# Patient Record
Sex: Male | Born: 1935 | Race: White | Hispanic: No | Marital: Married | Smoking: Never smoker
Health system: Southern US, Community
[De-identification: ages and names within clinical notes are randomized; demographics above are authoritative.]

## PROBLEM LIST (undated history)

## (undated) DIAGNOSIS — E7800 Pure hypercholesterolemia, unspecified: Secondary | ICD-10-CM

## (undated) DIAGNOSIS — C801 Malignant (primary) neoplasm, unspecified: Secondary | ICD-10-CM

## (undated) DIAGNOSIS — J45909 Unspecified asthma, uncomplicated: Secondary | ICD-10-CM

## (undated) DIAGNOSIS — I2510 Atherosclerotic heart disease of native coronary artery without angina pectoris: Secondary | ICD-10-CM

## (undated) HISTORY — DX: Pure hypercholesterolemia, unspecified: E78.00

## (undated) HISTORY — PX: LEG SURGERY: SHX1003

## (undated) HISTORY — DX: Unspecified asthma, uncomplicated: J45.909

## (undated) HISTORY — PX: HERNIA REPAIR: SHX51

## (undated) HISTORY — PX: INSERTION PROSTATE RADIATION SEED: SUR718

## (undated) HISTORY — PX: CATARACT EXTRACTION, BILATERAL: SHX1313

## (undated) HISTORY — PX: CORONARY ARTERY BYPASS GRAFT: SHX141

---

## 1996-02-16 HISTORY — PX: CATARACT EXTRACTION W/PHACO: SHX586

## 1997-07-23 ENCOUNTER — Other Ambulatory Visit: Admission: RE | Admit: 1997-07-23 | Discharge: 1997-07-23 | Payer: Self-pay | Admitting: Urology

## 1998-08-28 ENCOUNTER — Other Ambulatory Visit: Admission: RE | Admit: 1998-08-28 | Discharge: 1998-08-28 | Payer: Self-pay | Admitting: Internal Medicine

## 1998-12-24 ENCOUNTER — Ambulatory Visit (HOSPITAL_COMMUNITY): Admission: RE | Admit: 1998-12-24 | Discharge: 1998-12-24 | Payer: Self-pay | Admitting: Urology

## 1998-12-25 ENCOUNTER — Ambulatory Visit (HOSPITAL_COMMUNITY): Admission: RE | Admit: 1998-12-25 | Discharge: 1998-12-25 | Payer: Self-pay | Admitting: Gastroenterology

## 1998-12-25 ENCOUNTER — Encounter (INDEPENDENT_AMBULATORY_CARE_PROVIDER_SITE_OTHER): Payer: Self-pay | Admitting: Specialist

## 1999-08-06 ENCOUNTER — Ambulatory Visit (HOSPITAL_COMMUNITY): Admission: RE | Admit: 1999-08-06 | Discharge: 1999-08-06 | Payer: Self-pay | Admitting: Gastroenterology

## 1999-08-06 ENCOUNTER — Encounter (INDEPENDENT_AMBULATORY_CARE_PROVIDER_SITE_OTHER): Payer: Self-pay | Admitting: Specialist

## 2001-02-15 HISTORY — PX: CATARACT EXTRACTION W/PHACO: SHX586

## 2001-06-16 ENCOUNTER — Ambulatory Visit (HOSPITAL_COMMUNITY): Admission: RE | Admit: 2001-06-16 | Discharge: 2001-06-16 | Payer: Self-pay | Admitting: Urology

## 2001-06-16 ENCOUNTER — Encounter: Payer: Self-pay | Admitting: Urology

## 2002-07-27 ENCOUNTER — Encounter (INDEPENDENT_AMBULATORY_CARE_PROVIDER_SITE_OTHER): Payer: Self-pay | Admitting: Specialist

## 2002-07-27 ENCOUNTER — Ambulatory Visit (HOSPITAL_COMMUNITY): Admission: RE | Admit: 2002-07-27 | Discharge: 2002-07-27 | Payer: Self-pay | Admitting: *Deleted

## 2003-12-17 ENCOUNTER — Encounter: Admission: RE | Admit: 2003-12-17 | Discharge: 2003-12-17 | Payer: Self-pay | Admitting: Urology

## 2006-10-24 ENCOUNTER — Inpatient Hospital Stay (HOSPITAL_COMMUNITY): Admission: AD | Admit: 2006-10-24 | Discharge: 2006-10-26 | Payer: Self-pay | Admitting: Internal Medicine

## 2006-11-10 ENCOUNTER — Encounter (HOSPITAL_COMMUNITY): Admission: RE | Admit: 2006-11-10 | Discharge: 2007-02-08 | Payer: Self-pay | Admitting: Interventional Cardiology

## 2008-01-29 ENCOUNTER — Ambulatory Visit: Payer: Self-pay | Admitting: Vascular Surgery

## 2010-03-18 ENCOUNTER — Other Ambulatory Visit: Payer: Self-pay | Admitting: Dermatology

## 2010-06-30 NOTE — Cardiovascular Report (Signed)
[name] NO.:  [id number]   MEDICAL RECORD NO.:  [id number]          PATIENT TYPE:  INP   LOCATION:  2807                         FACILITY:  [facility]   PHYSICIAN:  [name], M.D.   DATE OF BIRTH:  11-03-1932   DATE OF PROCEDURE:  DATE OF DISCHARGE:                            CARDIAC CATHETERIZATION   INDICATIONS:  Acute coronary syndrome.   PROCEDURE PERFORMED:  1. Left heart cath  2. Selective coronary angiography  3. Left ventriculography.  4. Catheter thrombectomy.  5. Bare metal stent implantation.   DESCRIPTION:  After informed consent under urgent circumstances, the  patient was brought to the laboratory where a 6-French sheath was placed  using modified Seldinger technique.  A 6-French A2 multipurpose catheter  was then used for hemodynamic recordings, left ventriculography by hand  injection, and selective right coronary angiography.  A 6-French left  Judkins catheter was used for left coronary angiography.  We identified  high-grade obstruction in the mid circumflex with a large distal  territory.  After some discussion, we proceeded with PCI.   The patient had been started on upstream Bivalirudin therapy before he  left the floor headed to the cath lab.  He was loaded with 600 mg of  Plavix.  We used an additional bolus of Angiomax  to get ACT greater  than 300 seconds.  We then used ACLS 6-French left coronary system guide  catheter to perform PCI with a [employer] guidewire.  We used a  Fetch thrombectomy catheter and two passes were made.  No obvious  thrombus was recovered.   We then performed PCI using a 3.0 x 15 Maverick balloon.  After  reestablishing brisk antegrade flow, we placed a stent, bare metal  Vision 3.5 x 18.  After stent deployment there was slow flow noted in  the TIMI grade I-II category.  We immediately performed pharmacologic  therapy by giving intracoronary nitroglycerin initially 400 micrograms  and  also 52 micrograms of adenosine intracoronary.  Both of these  medications were given in aliquots of 50% of the total dose mentioned.  We then postdilated with a 3.75 x 15 Quantum balloon to 12 atmospheres  each for approximately 30 seconds.  No waist was noted on the balloon.  Additional pharmacologic therapy with 2 or 3 additional 200 microgram  aliquots of nitroglycerin and another 2-3 aliquots of intracoronary  adenosine each of 26 mcg.  There was gradual resolution and slow flow ST-  segment resolution.  At the completion of the case Angio-Seal was  performed with good hemostasis.  The patient left the room.  Chest  discomfort was present but less than grade 2.  During the procedure it  had been as high as grade 10/10.  Total duration of chest discomfort in  the laboratory was approximately 60 minutes.   RESULTS:   HEMODYNAMIC DATA:  1. Aortic pressure 124/72.  2. Left ventricular pressure 125/12.   LEFT VENTRICULOGRAPHY:  Distal anterolateral hypokinesis is noted.  EF  50%.   CORONARY ANGIOGRAPHY:  1. Left main coronary:  Widely patent.  2.  Left anterior descending coronary:  Less than 50% proximal with a      large proximal arising first diagonal that contains ostial 40%      stenosis.  3. Circumflex artery:  The midvessel contains a thrombotic bulky 99%      stenosis with a large distal distribution giving origin to obtuse      marginals.  4. RCA widely patent.   CIRCUMFLEX PCI:  Circumflex 99% with decreased flow pre PCI and  following Fetch, balloon angioplasty, stent deployment, and post  dilatation 0% stenosis was noted with eventual attainment of TIMI grade  3 flow with pharmacologic therapy and resolution of ST elevation.   ANGIOSEAL ARTERIOTOMY CLOSURE:  With good hemostasis.   CONCLUSION:  1. Acute coronary syndrome secondary to high-grade thrombotic      circumflex lesion treated successfully with bare metal stent from      99% to 0% initially complicated by  low flow but resolved with      pharmacologic therapy.  2. Less than 50% proximal LAD normal right coronary.  3. Left ventricular wall motion abnormality involving the      anterolateral wall and circumflex distribution.   PLAN:  1. Integrilin x18 hours.  2. Angiomax discontinued.  3. Aspirin and Plavix for one year.  4. Serial markers.  5. Home Wednesday or Thursday depending upon course and degree of      enzyme elevation.      [name], M.D.  Electronically Signed     HWS/MEDQ  D:  10/24/2006  T:  10/25/2006  Job:  [id number]   cc:   Candyce Churn, M.D.

## 2010-06-30 NOTE — Discharge Summary (Signed)
NAMECHRISTIAN, [name]                ACCOUNT NO.:  [id number]   MEDICAL RECORD NO.:  [id number]          PATIENT TYPE:  INP   LOCATION:  2922                         FACILITY:  [facility]   PHYSICIAN:  Candyce Churn, M.D.DATE OF BIRTH:  20-Jan-1933   DATE OF ADMISSION:  10/24/2006  DATE OF DISCHARGE:  10/26/2006                               DISCHARGE SUMMARY   DISCHARGE DIAGNOSES:  1. Acute coronary syndrome secondary to 99% circumflex lesion.  2. Coronary stenting to the circumflex with grade 3 TIMI flow      postprocedure and 0% stenosis.  3. Mild elevation in cardiac enzymes following the stenting procedure      secondary to washout phenomenon with mild chest pain - resolved.  4. History of hypercholesterolemia.  5. Mild type 2 diabetes mellitus.  6. Prostate cancer - October 1997.  7. Erectile dysfunction.  8. Gastroesophageal reflux disease.  9. Exercise-induced asthma.  10.History of tubulovillous adenoma - followed by Dr. Danise Edge.  11.Question of a transient ischemic attack in May 2001 - had some      speech difficulty at the time and carotid Dopplers revealed      calcific plaque but no significant stenosis.   DISCHARGE MEDICATIONS:  1. Plavix 75 mg daily.  2. Ecotrin 325 mg daily.  3. Protonix 40 mg daily.  4. Azmacort to puffs inhaled b.i.d.  5. Intal inhaler 2 puffs b.i.d.  6. Allegra 60 mg p.o. daily p.r.n.  7. Vytorin 10/40 mg daily.  8. Glucophage 500 mg b.i.d. - resume October 31, 2006.  9. Sublingual nitroglycerin 0.4 mg p.r.n. chest pain every five      minutes p.r.n. chest pain.   CONSULTATION:  Cardiology, Dr. Verdis Prime.   PROCEDURES:  Cardiac catheterization October 24, 2006, per Dr. Verdis Prime revealed an ejection fraction of 50% with anterolateral  hypokinesis.  Left main was normal.  LAD had less than 50% proximal  lesion.  Circumflex had 99% mid thrombotic lesion with a large distal  distribution Right coronary was normal.   The  patient had a bare metal stent placed to the circumflex with initial  99% occlusion being stented to 0% occlusion with complication of slow  flow with pain for 1-2 hours at procedure.  He did have a left  ventricular wall motion abnormality at the time and EF was 60%.  Treated  with Integrilin, with Angiomax and aspirin and Plavix.   HOSPITAL COURSE:  The patient is a very pleasant 77 year old male who  had approximately one week of intermittent substernal chest pain,  characterized as burning with some radiation to his left shoulder and  arm.  It was initially described as nonexertional and then he recalled  that he did have some exertional chest pain 7-10 days ago with walking.  Denied any throat or neck pain.  His pain did respond to sublingual  nitroglycerin on the day prior to admission.  Antacids were not helpful.  He denied any fevers or chills or shortness of breath, diaphoresis or  PND.  He presented to [facility] with an EKG that  showed a very minimal elevation in ST-segment V1 to V3 - 0.5 to 1 mm and  otherwise normal EKG.   He was admitted to [facility] and seen in consultation by Dr.  Verdis Prime and the above procedures were performed with the diagnosis  of coronary artery disease, with critical stenosis in his left  circumflex.   The patient has done well since his procedure with some chest pain for  approximately 12 hours after the procedure.  He is now approximately 24  hours without any chest pain and feeling well and plans are to discharge  him home to start cardiac rehab as per cardiology and to follow-up with  cardiology at their discretion.   We will withhold ACE receptor blockade and beta blockers because of his  relative bradycardia and his borderline low blood pressure at 100  systolic.   He will be discharged on the above medications with further changes per  Dr. Katrinka Blazing and he can resume his Glucophage next week and he will  follow  his blood sugars b.i.d.   DISCHARGE LABORATORY DATA:  Sed rate 13 mm an hour.  Sodium 139,  potassium 4.5, chloride 103, bicarb 28, glucose 109, BUN 11, creatinine  1.25, calcium 9.2.  Troponin at discharge is 2.88.  CK-MB was 7.7 down  from 19.6 from October 25, 2006.  CK was within normal limits at 160  having peaked at 330 on October 25, 2006.  CK-MB on discharge 4.8,  trending down from 7.5 the day before.  Hemoglobin was 13.2, white cell  count 7200, platelet count 293,000.   Again, CK max was 330 with CK-MB max was 27.2 and a troponin-I of 7.08  max on October 25, 2006.   Postprocedure EKG on October 25, 2006, revealed sinus bradycardia at  52,  left anterior fascicular block and nonspecific ST-T wave  abnormalities noted.  No significant change from EKG of October 24, 2006.      Candyce Churn, M.D.  Electronically Signed     RNG/MEDQ  D:  10/26/2006  T:  10/26/2006  Job:  [id number]   cc:   [name], M.D.

## 2010-06-30 NOTE — Consult Note (Signed)
[name], [name]                ACCOUNT NO.:  [id number]   MEDICAL RECORD NO.:  [id number]          PATIENT TYPE:  INP   LOCATION:  2922                         FACILITY:  [facility]   PHYSICIAN:  [name], M.D.   DATE OF BIRTH:  1932-06-29   DATE OF CONSULTATION:  10/24/2006  DATE OF DISCHARGE:                                 CONSULTATION   CARDIOLOGY CONSULTATION:   REASON FOR CONSULTATION:  Chest discomfort.   CONCLUSIONS:  1. Acute coronary syndrome with crescendo pattern, of concern for pre-      infarction syndrome.  2. History of hyperlipidemia, treated.  3. Diabetes.   RECOMMENDATIONS:  1. Start antithrombin therapy in the form of bivalirudin bolus and      infusion.  2. Aspirin.  3. We will withhold Plavix until anatomy is defined.  4. Urgent cardiac catheterization.  5. IV nitroglycerin.  6. Heart catheterization possible PCI, possible surgery and attendant      risks involved in emergency instrumentation during acute coronary      syndrome is discussed with the patient and wife.  Quoted 2-3% risk      of myocardial infarction, 1% risk of need for emergency surgery,      bleeding, contrast allergy, death, among others.   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  For slightly under a  week he has been experiencing recurring midsternal chest discomfort with  radiation to the back.  The initial episode occurred awakening him from  sleep one evening approximately 5 days prior to admission.  Through the  weekend he had progressively more frequent episodes of recurrent  discomfort, each lasting anywhere from 20 to 45 minutes before  resolving.  He spoke with Dr. Kevan Ny by phone this weekend and  nitroglycerin was given by phone-in.  He has been using nitroglycerin  over the weekend with relief of the discomfort.  He came to see Dr.  Kevan Ny this morning and has been admitted to the hospital  with crescendo  recurrent episodes of angina.  Most recent episode approximately 45  minutes before this consultation was performed, which had lasted  anywhere from 30 to 60 minutes.  EKG during pain demonstrated hyperacute  precordial T-wave abnormality but no ST elevation and no frank Q waves,  although there is incomplete right bundle branch block versus true  posterior MI and lateral T-wave inversions in I and aVL.   MEDICATIONS:  1. Vytorin 10/40 mg.  2. Albuterol and Azmacort daily.  3. Protonix daily.  4. Aspirin 81 mg two daily.  5. Glucophage 500 mg a.m., 1000 mg p.m.   HABITS:  Does not smoke.  He has a glass of wine usually daily.   SIGNIFICANT PAST MEDICAL HISTORY:  1. Hyperlipidemia.  2. Prostate cancer treated with seeds.  3. Erectile dysfunction.  4. GERD.  5. Asthma.  6. Colonic tubulovillous adenoma.  7. Transient ischemic attack 2001.   EXAM:  The patient is in no acute distress.  He is not having chest  discomfort currently.  Blood pressure 145/94, heart rate 60, weight is not recorded,  respirations 20.  HEENT:  Unremarkable.  CHEST:  Clear.  CARDIAC:  No murmur, no click and no rub.  ABDOMEN:  Soft.  EXTREMITIES:  No edema.  NEUROLOGIC:  Unremarkable.   ECG reveals sinus bradycardia, tall RV-1, most compatible with  incomplete right bundle, cannot rule out a posterior Q-wave infarction.  There is prominent peaked T-wave abnormality noted in the early  precordial leads, T-wave inversion noted in I and aVL.   LABORATORY DATA:  All pending.   Chest x-ray is pending.   DISCUSSION:  This patient is having crescendo/pre-infarction angina.  I  suspect he will have positive markers.  With recurrent episodes of  discomfort, we will go ahead and emergently take the patient to the cath  lab to define anatomy and, hopefully, provide some mechanical relief of  the patient's symptoms.  Angiomax is started.  Aspirin will be given.  Plavix will be  withheld.      [name], M.D.  Electronically Signed     HWS/MEDQ  D:  10/25/2006  T:  10/25/2006  Job:  [id number]   cc:   Candyce Churn, M.D.

## 2010-06-30 NOTE — Procedures (Signed)
CAROTID DUPLEX EXAM   INDICATION:  Carotid artery disease.   HISTORY:  Diabetes:  Yes.  Cardiac:  Yes.  Hypertension:  No.  Smoking:  No.  Previous Surgery:  No.  CV History:  TIA.  Amaurosis Fugax No, Paresthesias No, Hemiparesis No.                                       RIGHT             LEFT  Brachial systolic pressure:  Brachial Doppler waveforms:  Vertebral direction of flow:        Antegrade         Antegrade  DUPLEX VELOCITIES (cm/sec)  CCA peak systolic                   103               81  ECA peak systolic                   131               93  ICA peak systolic                   76                88  ICA end diastolic                   31                35  PLAQUE MORPHOLOGY:                                    Calcified  PLAQUE AMOUNT:                                        Moderate  PLAQUE LOCATION:                                      Bifurcation, ICA   IMPRESSION:  1. Normal right internal carotid artery.  2. 20-39% left internal carotid artery stenosis.       ___________________________________________  Quita Skye Hart [place], M.D.   AC/MEDQ  D:  01/31/2008  T:  01/31/2008  Job:  [id number]

## 2010-06-30 NOTE — H&P (Signed)
[name], [name]                ACCOUNT NO.:  [id number]   MEDICAL RECORD NO.:  [id number]          PATIENT TYPE:  INP   LOCATION:  2807                         FACILITY:  [facility]   PHYSICIAN:  Candyce Churn, M.D.DATE OF BIRTH:  07-22-1932   DATE OF ADMISSION:  10/24/2006  DATE OF DISCHARGE:                              HISTORY & PHYSICAL   CHIEF COMPLAINT:  Chest pain.   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  He also has a  history of mild aortic sclerosis.   Over the past week, the patient has noticed substernal chest burning  which has been resistant to increasing doses of PPI therapy and added H2  blockers.  Oral antacids have not been helpful.  He has not had  diaphoresis or shortness of breath and does not seem to have effected  his exertional tolerance.  It is substernal.  It does burn, but also is  referred to his left shoulder and down his left arm.  No throat or neck  pain history.  Otherwise feels well.  Denies any fevers or chills.  It  has responded to some nitroglycerin over the past 24 hours, usually in  about 5 or 6 minutes after taking nitroglycerin it will go away.  There  is a burning sensation initially, substernally and then a squeezing  sensation that radiates to the left back and shoulder.   His EKG today reveals an ST-segment elevation in V1 through V3 which is  0.5 to 1 mm but does seem to be present in comparison to old EKGs.   MEDICATIONS:  1. Glucophage 500 mg b.i.d.  2. Vytorin 10/40 daily.  3. Aspirin 81 mg a day.  4. Azmacort inhaler two puffs b.i.d.  5. Intal inhaler 2 puffs b.i.d.  6. Albuterol inhaler 1-2 puffs p.r.n.  7. Allegra 60 mg p.r.n.  8. Selenium sulfides shampoo used for scalp seborrhea.  9. Prilosec 20 twice daily and then changed to Protonix 40 mg twice      daily  yesterday.  10.The patient has also been using some Zantac.   OTHER PAST MEDICAL HISTORY:  Treatment for prostate cancer in 1997 with  seed implantation and then Lupron therapy.   PAST SURGICAL HISTORY:  1. Removal of a tubulovillous adenoma colonoscopically in June 2004.  2. Hemorrhoidectomy 1965.  3. Prostate biopsy in 1998.  4. Radium seed implants for prostate 1998.  5. Right inguinal hernia (1990).  6. Distant history of tonsillectomy and adenoidectomy.   HEALTH MAINTENANCE:  Last tetanus was in February 1998.  Flu vaccine  November 2007.  Pneumovax October 2002.   FAMILY HISTORY:  Father died of metastatic prostate cancer.  Mother had  rheumatic heart disease.   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  Lives in [place].  No tobacco use.  Has a glass of  wine on a fairly regular basis.   REVIEW OF SYSTEMS:  As per HPI.  No recent change in bowel habits or  urinary habits.  No fever or chills as mentioned.   PHYSICAL EXAMINATION:  GENERAL:  Alert, oriented male in no distress,  complaining of pain that is intermittent.  No response to nitroglycerin  substernally.  VITAL SIGNS:  Temperature is 97.7, pulse 58 and regular, blood pressure  pending.  Respiratory rate is 20 and nonlabored.  HEENT:  Benign.  Wears glasses.  Oropharynx is clear.  NECK:  Supple without JVD, thyromegaly, or carotid bruits.  CHEST:  Clear to auscultation.  CARDIOVASCULAR:  Regular rhythm.  No murmurs or gallops or rubs.  ABDOMEN:  Soft, nontender.  EXTREMITIES:  Without cyanosis, clubbing or edema.  GENITALIA:  Not examined.  RECTAL:  Not performed.  NEUROLOGICAL:  Alert and oriented x3.  Nonfocal   His EKG reveals sinus brady at 53.  There is a question of ST-segment  elevation of 1 mm V1-V3.   ASSESSMENT:  Chest pain with possible coronary ischemia.  Could be  esophageal spasm.  If cardiac workup is negative, consider upper  endoscopy.  Wonder if  he could also possibly have esophageal lesion or  ulcer that is causing esophageal spasm.  This certainly sounds  suspicious for coronary artery disease currently.   PLAN:  Will admit and place on cardiac monitor and oxygen and give  Lovenox, aspirin and check serial cardiac enzymes.  Dr. Verdis Prime will  consult.  He will use sublingual nitroglycerin for recurrent pain.      Candyce Churn, M.D.  Electronically Signed     RNG/MEDQ  D:  10/24/2006  T:  10/24/2006  Job:  [id number]   cc:   [name], M.D.  [name], M.D.  [name], M.D.  [name], M.D.

## 2010-07-03 NOTE — Op Note (Signed)
[name], [name]                            ACCOUNT NO.:  [id number]   MEDICAL RECORD NO.:  [id number]                   PATIENT TYPE:  AMB   LOCATION:  ENDO                                 FACILITY:  [facility]   PHYSICIAN:  Danise Edge, M.D.                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  Approximately three years ago, the patient  underwent his first screening colonoscopy and had a number of neoplastic,  but noncancerous polyps removed.  I repeated his colonoscopy six months  later and two small neoplastic polyps were removed.  The patient is  scheduled to undergo a repeat screening colonoscopy with polypectomy to  prevent colon cancer.   ENDOSCOPIST:  Danise Edge, M.D.   PREMEDICATION:  Versed 7.5 mg, Demerol 70 mg.   DESCRIPTION OF PROCEDURE:  After obtaining informed consent, the patient was  placed in the left lateral decubitus position. I administered intravenous  Demerol and intravenous Versed to achieve conscious sedation for the  procedure.  The patient's blood pressure, oxygen saturation, and cardiac  rhythm were monitored throughout the procedure and documented in the medical  record.   Anal inspection was normal.  The patient has received radiation therapy  (brachytherapy) for prostate cancer.  The Olympus adult colonoscope was  introduced into the rectum and easily advanced to the cecum. A normal  appearing ileocecal valve was intubated and the distal ileum inspected.   Rectum; normal.   Sigmoid colon and descending colon; normal.   Splenic flexure; normal.   Transverse colon; from the proximal transverse colon, a 3 mm sessile polyp  was lifted by submucosal saline injection and removed with the  electrocautery snare.  A 1 mm and 2 mm sessile polyps were  removed with the  electrocautery snare.   Hepatic flexure; normal.   Ascending colon; from the mid ascending colon, a 1 mm sessile polyp was  removed with the cold biopsy forceps in its entirety.   Cecum and ileocecal valve; normal.   Distal ileum; normal.    ASSESSMENT:  A 1 mm polyp was removed from the ascending colon, two small 1  mm - 2 mm polyps were removed from the proximal transverse colon, and a 3-4  mm size polyp was lifted by saline injection submucosally and removed with  the electrocautery snare.                                               Danise Edge, M.D.    MJ/MEDQ  D:  07/27/2002  T:  07/28/2002  Job:  [id number]  cc:   Candyce Churn, M.D.  [address] [place]  [place] [address]  Fax: [phone]

## 2010-11-11 ENCOUNTER — Other Ambulatory Visit: Payer: Self-pay | Admitting: Dermatology

## 2010-11-27 LAB — CARDIAC PANEL(CRET KIN+CKTOT+MB+TROPI)
CK, MB: 29.5 — ABNORMAL HIGH
CK, MB: 7.7 — ABNORMAL HIGH
Relative Index: 4.8 — ABNORMAL HIGH
Relative Index: 6.1 — ABNORMAL HIGH
Relative Index: INVALID
Total CK: 91
Troponin I: 0.81

## 2010-11-27 LAB — CBC
HCT: 37.4 — ABNORMAL LOW
HCT: 38.4 — ABNORMAL LOW
HCT: 45.7
Hemoglobin: 12.8 — ABNORMAL LOW
Hemoglobin: 13.2
MCHC: 34.4
MCV: 100
MCV: 99
Platelets: 333
RBC: 3.78 — ABNORMAL LOW
RDW: 13
RDW: 13.3
RDW: 13.3
WBC: 7.2
WBC: 8.2

## 2010-11-27 LAB — COMPREHENSIVE METABOLIC PANEL
AST: 41 — ABNORMAL HIGH
Albumin: 4.1
BUN: 13
BUN: 13
Calcium: 9.4
Chloride: 102
Creatinine, Ser: 1.17
GFR calc Af Amer: >60
Glucose, Bld: 111 — ABNORMAL HIGH
Total Bilirubin: 3 — ABNORMAL HIGH
Total Protein: 6.2
Total Protein: 7

## 2010-11-27 LAB — BASIC METABOLIC PANEL
BUN: 11
BUN: 12
CO2: 28
Calcium: 8.7
Chloride: 103
Creatinine, Ser: 1.23
GFR calc non Af Amer: 58 — ABNORMAL LOW
Glucose, Bld: 104 — ABNORMAL HIGH
Glucose, Bld: 109 — ABNORMAL HIGH
Potassium: 4.5
Sodium: 139
Sodium: 139

## 2010-11-27 LAB — I-STAT 8, (EC8 V) (CONVERTED LAB)
Acid-Base Excess: 1
Bicarbonate: 22.6
Potassium: 4
Sodium: 137
TCO2: 23
pH, Ven: 7.517 — ABNORMAL HIGH

## 2010-11-27 LAB — POCT I-STAT CREATININE
Creatinine, Ser: 1.2
Operator id: 250661

## 2010-11-27 LAB — PROTIME-INR
INR: 1
Prothrombin Time: 13.4

## 2010-11-27 LAB — SEDIMENTATION RATE: Sed Rate: 13

## 2010-11-27 LAB — CK TOTAL AND CKMB (NOT AT ARMC)
CK, MB: 27.2 — ABNORMAL HIGH
Relative Index: 9.6 — ABNORMAL HIGH

## 2010-11-27 LAB — TROPONIN I: Troponin I: 4.64

## 2010-11-27 LAB — APTT: aPTT: 29

## 2011-04-06 DIAGNOSIS — J45901 Unspecified asthma with (acute) exacerbation: Secondary | ICD-10-CM | POA: Diagnosis not present

## 2011-04-06 DIAGNOSIS — J209 Acute bronchitis, unspecified: Secondary | ICD-10-CM | POA: Diagnosis not present

## 2011-04-12 DIAGNOSIS — R062 Wheezing: Secondary | ICD-10-CM | POA: Diagnosis not present

## 2011-04-14 DIAGNOSIS — I10 Essential (primary) hypertension: Secondary | ICD-10-CM | POA: Diagnosis not present

## 2011-04-14 DIAGNOSIS — N529 Male erectile dysfunction, unspecified: Secondary | ICD-10-CM | POA: Diagnosis not present

## 2011-04-14 DIAGNOSIS — E559 Vitamin D deficiency, unspecified: Secondary | ICD-10-CM | POA: Diagnosis not present

## 2011-04-14 DIAGNOSIS — Z79899 Other long term (current) drug therapy: Secondary | ICD-10-CM | POA: Diagnosis not present

## 2011-04-14 DIAGNOSIS — C61 Malignant neoplasm of prostate: Secondary | ICD-10-CM | POA: Diagnosis not present

## 2011-04-14 DIAGNOSIS — Z23 Encounter for immunization: Secondary | ICD-10-CM | POA: Diagnosis not present

## 2011-04-14 DIAGNOSIS — Z125 Encounter for screening for malignant neoplasm of prostate: Secondary | ICD-10-CM | POA: Diagnosis not present

## 2011-04-14 DIAGNOSIS — E119 Type 2 diabetes mellitus without complications: Secondary | ICD-10-CM | POA: Diagnosis not present

## 2011-04-14 DIAGNOSIS — E782 Mixed hyperlipidemia: Secondary | ICD-10-CM | POA: Diagnosis not present

## 2011-04-14 DIAGNOSIS — Z0000 Encounter for general adult medical examination without abnormal findings: Secondary | ICD-10-CM | POA: Diagnosis not present

## 2011-09-14 ENCOUNTER — Other Ambulatory Visit: Payer: Self-pay | Admitting: Dermatology

## 2011-09-14 DIAGNOSIS — D485 Neoplasm of uncertain behavior of skin: Secondary | ICD-10-CM | POA: Diagnosis not present

## 2011-09-14 DIAGNOSIS — C4441 Basal cell carcinoma of skin of scalp and neck: Secondary | ICD-10-CM | POA: Diagnosis not present

## 2011-09-14 DIAGNOSIS — L570 Actinic keratosis: Secondary | ICD-10-CM | POA: Diagnosis not present

## 2011-09-29 ENCOUNTER — Observation Stay (HOSPITAL_COMMUNITY)
Admission: EM | Admit: 2011-09-29 | Discharge: 2011-09-30 | Disposition: A | Discharge status: Home / Self Care | Payer: Medicare Other | Attending: Internal Medicine | Admitting: Internal Medicine

## 2011-09-29 ENCOUNTER — Emergency Department (HOSPITAL_COMMUNITY): Payer: Medicare Other

## 2011-09-29 ENCOUNTER — Encounter (HOSPITAL_COMMUNITY): Payer: Self-pay | Admitting: Emergency Medicine

## 2011-09-29 DIAGNOSIS — Z8546 Personal history of malignant neoplasm of prostate: Secondary | ICD-10-CM | POA: Insufficient documentation

## 2011-09-29 DIAGNOSIS — I2510 Atherosclerotic heart disease of native coronary artery without angina pectoris: Secondary | ICD-10-CM | POA: Insufficient documentation

## 2011-09-29 DIAGNOSIS — R310 Gross hematuria: Principal | ICD-10-CM | POA: Insufficient documentation

## 2011-09-29 DIAGNOSIS — D72829 Elevated white blood cell count, unspecified: Secondary | ICD-10-CM | POA: Diagnosis not present

## 2011-09-29 DIAGNOSIS — R809 Proteinuria, unspecified: Secondary | ICD-10-CM | POA: Diagnosis not present

## 2011-09-29 DIAGNOSIS — I129 Hypertensive chronic kidney disease with stage 1 through stage 4 chronic kidney disease, or unspecified chronic kidney disease: Secondary | ICD-10-CM | POA: Diagnosis not present

## 2011-09-29 DIAGNOSIS — Z79899 Other long term (current) drug therapy: Secondary | ICD-10-CM | POA: Insufficient documentation

## 2011-09-29 DIAGNOSIS — E871 Hypo-osmolality and hyponatremia: Secondary | ICD-10-CM | POA: Insufficient documentation

## 2011-09-29 DIAGNOSIS — E785 Hyperlipidemia, unspecified: Secondary | ICD-10-CM | POA: Diagnosis not present

## 2011-09-29 DIAGNOSIS — N179 Acute kidney failure, unspecified: Secondary | ICD-10-CM | POA: Insufficient documentation

## 2011-09-29 DIAGNOSIS — R319 Hematuria, unspecified: Secondary | ICD-10-CM | POA: Diagnosis present

## 2011-09-29 DIAGNOSIS — N189 Chronic kidney disease, unspecified: Secondary | ICD-10-CM | POA: Diagnosis present

## 2011-09-29 DIAGNOSIS — E119 Type 2 diabetes mellitus without complications: Secondary | ICD-10-CM | POA: Insufficient documentation

## 2011-09-29 HISTORY — DX: Atherosclerotic heart disease of native coronary artery without angina pectoris: I25.10

## 2011-09-29 HISTORY — DX: Malignant (primary) neoplasm, unspecified: C80.1

## 2011-09-29 LAB — CBC WITH DIFFERENTIAL/PLATELET
Lymphocytes Relative: 17 % (ref 12–46)
Lymphs Abs: 2 10*3/uL (ref 0.7–4.0)
MCV: 93.1 fL (ref 78.0–100.0)
Neutro Abs: 8.4 10*3/uL — ABNORMAL HIGH (ref 1.7–7.7)
Neutrophils Relative %: 74 % (ref 43–77)
Platelets: 250 10*3/uL (ref 150–400)
RBC: 3.93 MIL/uL — ABNORMAL LOW (ref 4.22–5.81)
WBC: 11.4 10*3/uL — ABNORMAL HIGH (ref 4.0–10.5)

## 2011-09-29 LAB — URINALYSIS, ROUTINE W REFLEX MICROSCOPIC
Bilirubin Urine: NEGATIVE
Nitrite: NEGATIVE
Specific Gravity, Urine: 1.02 (ref 1.005–1.030)
Urobilinogen, UA: 0.2 mg/dL (ref 0.0–1.0)
pH: 6 (ref 5.0–8.0)

## 2011-09-29 LAB — POCT I-STAT, CHEM 8
BUN: 12 mg/dL (ref 6–23)
Hemoglobin: 13.9 g/dL (ref 13.0–17.0)
Sodium: 122 mEq/L — ABNORMAL LOW (ref 135–145)
TCO2: 20 mmol/L (ref 0–100)

## 2011-09-29 LAB — URINE MICROSCOPIC-ADD ON

## 2011-09-29 MED ORDER — FENTANYL CITRATE 0.05 MG/ML IJ SOLN
50.0000 ug | Freq: Once | INTRAMUSCULAR | Status: AC
  Administered 2011-09-29: 50 ug via INTRAVENOUS
  Filled 2011-09-29: qty 2

## 2011-09-29 MED ORDER — PHENAZOPYRIDINE HCL 100 MG PO TABS
100.0000 mg | ORAL_TABLET | Freq: Three times a day (TID) | ORAL | Status: DC
  Administered 2011-09-30: 100 mg via ORAL
  Filled 2011-09-29 (×4): qty 1

## 2011-09-29 MED ORDER — HYDROCODONE-ACETAMINOPHEN 5-325 MG PO TABS
1.0000 | ORAL_TABLET | Freq: Once | ORAL | Status: AC
  Administered 2011-09-29: 1 via ORAL
  Filled 2011-09-29: qty 1

## 2011-09-29 MED ORDER — SODIUM CHLORIDE 0.9 % IV BOLUS (SEPSIS)
1000.0000 mL | Freq: Once | INTRAVENOUS | Status: AC
  Administered 2011-09-29: 1000 mL via INTRAVENOUS

## 2011-09-29 MED ORDER — ONDANSETRON HCL 4 MG/2ML IJ SOLN
4.0000 mg | Freq: Once | INTRAMUSCULAR | Status: AC
  Administered 2011-09-29: 4 mg via INTRAVENOUS
  Filled 2011-09-29: qty 2

## 2011-09-29 NOTE — ED Notes (Signed)
Obtained a reading of 44ml per Bladder Scan after pt voided.

## 2011-09-29 NOTE — ED Notes (Signed)
Pt c/o hematuria since yesterday, clots noted. Pt has had obstruction in past d/t clots. Pt does have prostate seeds.

## 2011-09-29 NOTE — ED Notes (Signed)
Pt given ice water.

## 2011-09-29 NOTE — ED Provider Notes (Signed)
History     CSN: [id number]  Arrival date & time 09/29/11  2035   First MD Initiated Contact with Patient 09/29/11 2121      Chief Complaint  Patient presents with  . Hematuria   HPI  History provided by the patient. Patient is a 78 year old male with history of diabetes, hypertension, hyperlipidemia, CAD, prostate cancer status post seeding who presents with complaints of hematuria and dysuria. Patient reports that symptoms began yesterday and have been persistent throughout the day. Patient has noticed blood with blood clots in urine. This causes some dysuria and fullness of bladder at times. Patient reports history of similar symptoms many years ago following his prostate seeding. Patient was told this is sometimes a side effect of the seeding process and stenosis and irritation of the bladder neck. Patient has increased frequency and states he has been drinking increased amounts of fluid to flush his system. Patient denies having any fever, chills, sweats, flank pain, nausea or vomiting. He denies any penile discharge.    Past Medical History  Diagnosis Date  . Cancer     prostate with seeds  . Coronary artery disease   . Diabetes mellitus     Past Surgical History  Procedure Date  . Coronary artery bypass graft   . Hernia repair     No family history on file.  History  Substance Use Topics  . Smoking status: Never Smoker   . Smokeless tobacco: Not on file  . Alcohol Use: Yes     daily      Review of Systems  Constitutional: Negative for fever and chills.  Gastrointestinal: Negative for nausea, vomiting and abdominal pain.  Genitourinary: Positive for frequency, hematuria and penile pain. Negative for flank pain and discharge.    Allergies  Sulfa antibiotics  Home Medications   Current Outpatient Rx  Name Route Sig Dispense Refill  . ALBUTEROL SULFATE HFA 108 (90 BASE) MCG/ACT IN AERS Inhalation Inhale 2 puffs into the lungs every 6 (six) hours as needed.  For shortness of breath.    . ASPIRIN EC 81 MG PO TBEC Oral Take 81 mg by mouth daily.    . ATORVASTATIN CALCIUM 40 MG PO TABS Oral Take 40 mg by mouth daily.    Marland Kitchen METFORMIN HCL 500 MG PO TABS Oral Take 500 mg by mouth at bedtime.    Marland Kitchen PANTOPRAZOLE SODIUM 40 MG PO TBEC Oral Take 40 mg by mouth daily.      Pulse 56  Temp 97.9 F (36.6 C) (Oral)  Resp 18  Ht 5\' 9"  (1.753 m)  Wt 161 lb (73.029 kg)  BMI 23.78 kg/m2  SpO2 100%  Physical Exam  Nursing note and vitals reviewed. Constitutional: He is oriented to person, place, and time. He appears well-developed and well-nourished. No distress.  HENT:  Head: Normocephalic.  Cardiovascular: Normal rate and regular rhythm.   No murmur heard. Pulmonary/Chest: Effort normal and breath sounds normal. No respiratory distress. He has no wheezes. He has no rales.  Abdominal: Soft. There is no tenderness. There is no rebound and no guarding.       No CVA tenderness  Genitourinary: Testes normal and penis normal. Right testis shows no tenderness. Left testis shows no tenderness.       No bleeding or discharge from penis  Neurological: He is alert and oriented to person, place, and time.  Skin: Skin is warm.  Psychiatric: He has a normal mood and affect. His behavior is normal.  ED Course  Procedures   Results for orders placed during the hospital encounter of 09/29/11  URINALYSIS, ROUTINE W REFLEX MICROSCOPIC      Component Value Range   Color, Urine YELLOW  YELLOW   APPearance CLOUDY (*) CLEAR   Specific Gravity, Urine 1.020  1.005 - 1.030   pH 6.0  5.0 - 8.0   Glucose, UA NEGATIVE  NEGATIVE mg/dL   Hgb urine dipstick LARGE (*) NEGATIVE   Bilirubin Urine NEGATIVE  NEGATIVE   Ketones, ur 40 (*) NEGATIVE mg/dL   Protein, ur >147 (*) NEGATIVE mg/dL   Urobilinogen, UA 0.2  0.0 - 1.0 mg/dL   Nitrite NEGATIVE  NEGATIVE   Leukocytes, UA NEGATIVE  NEGATIVE  URINE MICROSCOPIC-ADD ON      Component Value Range   RBC / HPF TOO NUMEROUS TO  COUNT  <3 RBC/hpf   Urine-Other FIELD OBSCURED BY RBC'S    POCT I-STAT, CHEM 8      Component Value Range   Sodium 122 (*) 135 - 145 mEq/L   Potassium 3.5  3.5 - 5.1 mEq/L   Chloride 91 (*) 96 - 112 mEq/L   BUN 12  6 - 23 mg/dL   Creatinine, Ser 8.29  0.50 - 1.35 mg/dL   Glucose, Bld 562 (*) 70 - 99 mg/dL   Calcium, Ion 1.30 (*) 1.13 - 1.30 mmol/L   TCO2 20  0 - 100 mmol/L   Hemoglobin 13.9  13.0 - 17.0 g/dL   HCT 86.5  78.4 - 69.6 %  CBC WITH DIFFERENTIAL      Component Value Range   WBC 11.4 (*) 4.0 - 10.5 K/uL   RBC 3.93 (*) 4.22 - 5.81 MIL/uL   Hemoglobin 13.3  13.0 - 17.0 g/dL   HCT 29.5 (*) 28.4 - 13.2 %   MCV 93.1  78.0 - 100.0 fL   MCH 33.8  26.0 - 34.0 pg   MCHC 36.3 (*) 30.0 - 36.0 g/dL   RDW 44.0  10.2 - 72.5 %   Platelets 250  150 - 400 K/uL   Neutrophils Relative 74  43 - 77 %   Neutro Abs 8.4 (*) 1.7 - 7.7 K/uL   Lymphocytes Relative 17  12 - 46 %   Lymphs Abs 2.0  0.7 - 4.0 K/uL   Monocytes Relative 8  3 - 12 %   Monocytes Absolute 0.9  0.1 - 1.0 K/uL   Eosinophils Relative 0  0 - 5 %   Eosinophils Absolute 0.0  0.0 - 0.7 K/uL   Basophils Relative 0  0 - 1 %   Basophils Absolute 0.0  0.0 - 0.1 K/uL  LACTIC ACID, PLASMA      Component Value Range   Lactic Acid, Venous 2.2  0.5 - 2.2 mmol/L      Ct Abdomen Pelvis Wo Contrast  09/30/2011  *RADIOLOGY REPORT*  Clinical Data: Hematuria, history nephrolithiasis.  CT ABDOMEN AND PELVIS WITHOUT CONTRAST  Technique:  Multidetector CT imaging of the abdomen and pelvis was performed following the standard protocol without intravenous contrast.  Comparison: 06/16/2001 by report only  Findings: Patchy infiltrate, atelectasis or scarring posteriorly in the visualized lower lobes, right greater than left.  Coronary and aortic calcifications.  Unremarkable uninfused evaluation of liver, nondistended gallbladder, spleen, adrenal glands kidneys.  No nephrolithiasis or hydronephrosis.  Ureters are decompressed without calculus.   Urinary bladder decompressed by Foley catheter. Multiple metallic seeds in the region of the prostate.  Mild pancreatic parenchymal atrophy.  Stomach and small bowel are nondistended.  Appendix not discretely identified.  The colon is nondilated, unremarkable.  No ascites.  No free air.  Patchy aortoiliac arterial calcifications without aneurysm.  No adenopathy localized.  Spondylitic changes in the lumbar spine. 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  IMPRESSION: 1.  Negative for nephrolithiasis, ureteral calculus, hydronephrosis, or other acute abnormality. 2. Atelectasis or infiltrate posteriorly in the visualized lung bases, right greater than left. 3. Atherosclerosis, including . coronary artery disease. Please note that although the presence of coronary artery calcium documents the presence of coronary artery disease, the severity of this disease and any potential stenosis cannot be assessed on this non-gated CT examination.  Assessment for potential risk factor modification, dietary therapy or pharmacologic therapy may be warranted, if clinically indicated.  Original Report Authenticated By: Thora Lance III, M.D.     1. Hyponatremia   2. Hematuria       MDM  9:25PM patient seen and evaluated. Patient with mild/moderate discomfort at this time.  Patient discussed with attending physician. Will consult urology.  Spoke with Dr. Laverle Patter with urology. Recommend checking a urine for possible infection also recommends placing a Foley catheter if patient feels he is having retention symptoms.   Foley catheter was placed successfully. There was 50 cc output however patient continues to feel similar symptoms of pain and discomfort in the inguinal area. I discussed with patient may need to explore other causes of symptoms such as kidney stone or kidney problems. Will order lab testing and noncontrast CT.  Patient seen and evaluated attending physician. Will plan for admission  and observation due to hyponatremia.    Angus Seller, [place] 09/30/11 [phone]

## 2011-09-30 DIAGNOSIS — R319 Hematuria, unspecified: Secondary | ICD-10-CM | POA: Diagnosis not present

## 2011-09-30 DIAGNOSIS — R310 Gross hematuria: Secondary | ICD-10-CM | POA: Diagnosis not present

## 2011-09-30 DIAGNOSIS — N189 Chronic kidney disease, unspecified: Secondary | ICD-10-CM | POA: Diagnosis present

## 2011-09-30 DIAGNOSIS — E119 Type 2 diabetes mellitus without complications: Secondary | ICD-10-CM | POA: Diagnosis not present

## 2011-09-30 DIAGNOSIS — D72829 Elevated white blood cell count, unspecified: Secondary | ICD-10-CM | POA: Diagnosis present

## 2011-09-30 DIAGNOSIS — E871 Hypo-osmolality and hyponatremia: Secondary | ICD-10-CM

## 2011-09-30 DIAGNOSIS — R809 Proteinuria, unspecified: Secondary | ICD-10-CM | POA: Diagnosis present

## 2011-09-30 DIAGNOSIS — R112 Nausea with vomiting, unspecified: Secondary | ICD-10-CM | POA: Diagnosis not present

## 2011-09-30 LAB — BASIC METABOLIC PANEL
BUN: 11 mg/dL (ref 6–23)
BUN: 9 mg/dL (ref 6–23)
CO2: 25 mEq/L (ref 19–32)
Calcium: 8.1 mg/dL — ABNORMAL LOW (ref 8.4–10.5)
Calcium: 8.1 mg/dL — ABNORMAL LOW (ref 8.4–10.5)
Chloride: 91 mEq/L — ABNORMAL LOW (ref 96–112)
Creatinine, Ser: 0.79 mg/dL (ref 0.50–1.35)
Creatinine, Ser: 0.84 mg/dL (ref 0.50–1.35)
GFR calc Af Amer: >90 mL/min (ref >90)
Glucose, Bld: 136 mg/dL — ABNORMAL HIGH (ref 70–99)

## 2011-09-30 LAB — CBC
MCH: 34.1 pg — ABNORMAL HIGH (ref 26.0–34.0)
MCHC: 36.6 g/dL — ABNORMAL HIGH (ref 30.0–36.0)
MCV: 93 fL (ref 78.0–100.0)
Platelets: 228 10*3/uL (ref 150–400)
RBC: 3.58 MIL/uL — ABNORMAL LOW (ref 4.22–5.81)
RDW: 12.2 % (ref 11.5–15.5)

## 2011-09-30 LAB — MAGNESIUM: Magnesium: 1.5 mg/dL (ref 1.5–2.5)

## 2011-09-30 LAB — LACTIC ACID, PLASMA: Lactic Acid, Venous: 2.2 mmol/L (ref 0.5–2.2)

## 2011-09-30 LAB — PHOSPHORUS: Phosphorus: 3.2 mg/dL (ref 2.3–4.6)

## 2011-09-30 MED ORDER — ONDANSETRON HCL 4 MG PO TABS
4.0000 mg | ORAL_TABLET | Freq: Four times a day (QID) | ORAL | Status: DC | PRN
  Administered 2011-09-30: 4 mg via ORAL
  Filled 2011-09-30: qty 1

## 2011-09-30 MED ORDER — ASPIRIN EC 81 MG PO TBEC
81.0000 mg | DELAYED_RELEASE_TABLET | Freq: Every day | ORAL | Status: DC
  Filled 2011-09-30: qty 1

## 2011-09-30 MED ORDER — ENOXAPARIN SODIUM 40 MG/0.4ML ~~LOC~~ SOLN
40.0000 mg | SUBCUTANEOUS | Status: DC
  Administered 2011-09-30: 40 mg via SUBCUTANEOUS
  Filled 2011-09-30: qty 0.4

## 2011-09-30 MED ORDER — PANTOPRAZOLE SODIUM 40 MG PO TBEC
40.0000 mg | DELAYED_RELEASE_TABLET | Freq: Every day | ORAL | Status: DC
  Filled 2011-09-30: qty 1

## 2011-09-30 MED ORDER — ONDANSETRON HCL 4 MG/2ML IJ SOLN: 4.0000 mg | Freq: Four times a day (QID) | INTRAMUSCULAR | Status: DC | PRN

## 2011-09-30 MED ORDER — METFORMIN HCL 500 MG PO TABS
500.0000 mg | ORAL_TABLET | Freq: Every day | ORAL | Status: DC
  Filled 2011-09-30: qty 1

## 2011-09-30 MED ORDER — LORAZEPAM 2 MG/ML IJ SOLN
INTRAMUSCULAR | Status: AC
  Administered 2011-09-30
  Filled 2011-09-30: qty 1

## 2011-09-30 MED ORDER — SODIUM CHLORIDE 0.9 % IV SOLN
INTRAVENOUS | Status: DC
  Administered 2011-09-30: 03:00:00 via INTRAVENOUS

## 2011-09-30 MED ORDER — ATORVASTATIN CALCIUM 40 MG PO TABS
40.0000 mg | ORAL_TABLET | Freq: Every day | ORAL | Status: DC
  Administered 2011-09-30: 40 mg via ORAL
  Filled 2011-09-30: qty 1

## 2011-09-30 MED ORDER — HYDROCODONE-ACETAMINOPHEN 5-325 MG PO TABS: 1.0000 | ORAL_TABLET | ORAL | Status: DC | PRN

## 2011-09-30 MED ORDER — ALBUTEROL SULFATE HFA 108 (90 BASE) MCG/ACT IN AERS: 2.0000 | INHALATION_SPRAY | Freq: Four times a day (QID) | RESPIRATORY_TRACT | Status: DC | PRN

## 2011-09-30 MED ORDER — MORPHINE SULFATE 2 MG/ML IJ SOLN
1.0000 mg | INTRAMUSCULAR | Status: DC | PRN
  Administered 2011-09-30: 1 mg via INTRAVENOUS
  Filled 2011-09-30: qty 1

## 2011-09-30 MED ORDER — DEXTROSE 5 % IV SOLN
1.0000 g | INTRAVENOUS | Status: DC
  Administered 2011-09-30: 1 g via INTRAVENOUS
  Filled 2011-09-30: qty 10

## 2011-09-30 NOTE — ED Notes (Signed)
Pt had a moderate amount of emesis--- was just given a Percocet tablet.

## 2011-09-30 NOTE — Discharge Summary (Signed)
Physician Discharge Summary  NAME:[name]  QIO:[id number]  DOB: [birth year]   Admit date: 09/29/2011 Discharge date: 09/30/2011  Discharge Diagnoses:  Principal Problem: Hematuria - episode of gross hematuria that seems to be resolving. No evidence of infection. Is able to urinate with catheter out and we'll pursue cystoscopy as an outpatient to further workup. Abdominal CT scanning was not significant for any obvious renal neoplasm, etc. Active Problems:  Hyponatremia - repeat basic metabolic profile pending. It is somewhat better than 120 and improving, will discharge home and repeat tomorrow. 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 Yesterday he had spontaneous occurrence of gross hematuria with clots. This had happened about 8 years before approximately 3 years after radioactive implants for treatment of prostate cancer. Patient had spontaneous resolution of hematuria at that time and this seems to be occurring this time. He did hydrate vigorously with water when he started to have the clotting and gross hematuria trying to prevent any clot obstruction in the urethra,  et Karie Soda. Sodium level was 120 this morning and needs to be improving prior to discharge. 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 If improved, will discharge home, if not, will further workup in the hospital  Consults:  Urology - Dr. Su Grand  Disposition: Symptomatically improved  Discharge Orders    Future Orders Please Complete By Expires   Diet Carb Modified      Increase activity slowly      Discharge instructions      Comments:   As per above   Call MD for:  temperature >100.4      Call MD for:  persistant nausea and vomiting      Call MD for:  severe uncontrolled pain        Medication List  As of 09/30/2011  1:18 PM   TAKE these medications         albuterol 108 (90 BASE) MCG/ACT inhaler   Commonly known as: PROVENTIL HFA;VENTOLIN HFA   Inhale 2 puffs into the lungs every 6 (six) hours as needed. For shortness of breath.      aspirin EC 81 MG tablet   Take 81 mg by mouth daily.      atorvastatin 40 MG tablet   Commonly known as: LIPITOR   Take 40 mg by mouth daily.      metFORMIN 500 MG tablet   Commonly known as: GLUCOPHAGE   Take 500 mg by mouth at bedtime.      pantoprazole 40 MG tablet   Commonly known as: PROTONIX   Take 40 mg by mouth daily.             Things to follow up in the outpatient setting: need to followup sodium level in a.m. And also need to monitor for recurrent gross hematuria  Time coordinating discharge: 40 minutes total with visit this a.m. And consultation with urology as well as reviewing records and completing discharge summary  The results of significant diagnostics from this hospitalization (including imaging, microbiology, ancillary and laboratory) are listed below for reference.    Significant  Diagnostic Studies: Ct Abdomen Pelvis Wo Contrast  09/30/2011  *RADIOLOGY REPORT*  Clinical Data: Hematuria, history nephrolithiasis.  CT ABDOMEN AND PELVIS WITHOUT CONTRAST  Technique:  Multidetector CT imaging of the abdomen and pelvis was performed following the standard protocol without intravenous contrast.  Comparison: 06/16/2001 by report only  Findings: Patchy infiltrate, atelectasis or scarring posteriorly in the visualized lower lobes, right greater than left.  Coronary and aortic calcifications.  Unremarkable uninfused evaluation of liver, nondistended gallbladder, spleen, adrenal glands kidneys.   No nephrolithiasis or hydronephrosis.  Ureters are decompressed without calculus.  Urinary bladder decompressed by Foley catheter. Multiple metallic seeds in the region of the prostate.  Mild pancreatic parenchymal atrophy.  Stomach and small bowel are nondistended.  Appendix not discretely identified.  The colon is nondilated, unremarkable.  No ascites.  No free air.  Patchy aortoiliac arterial calcifications without aneurysm.  No adenopathy localized.  Spondylitic changes in the lumbar spine. 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  IMPRESSION: 1.  Negative for nephrolithiasis, ureteral calculus, hydronephrosis, or other acute abnormality. 2. Atelectasis or infiltrate posteriorly in the visualized lung bases, right greater than left. 3. Atherosclerosis, including . coronary artery disease. Please note that although the presence of coronary artery calcium documents the presence of coronary artery disease, the severity of this disease and any potential stenosis cannot be assessed on this non-gated CT examination.  Assessment for potential risk factor modification, dietary therapy or pharmacologic therapy may be warranted, if clinically indicated.  Original Report Authenticated By: Osa Craver, M.D.    Microbiology: No results found for this or any previous visit (from the past 240 hour(s)).   Labs: Results for orders placed during the hospital encounter of 09/29/11  URINALYSIS, ROUTINE W REFLEX MICROSCOPIC      Component Value Range   Color, Urine YELLOW  YELLOW   APPearance CLOUDY (*) CLEAR   Specific Gravity, Urine 1.020  1.005 - 1.030   pH 6.0  5.0 - 8.0   Glucose, UA NEGATIVE  NEGATIVE mg/dL   Hgb urine dipstick LARGE (*) NEGATIVE   Bilirubin Urine NEGATIVE  NEGATIVE   Ketones, ur 40 (*) NEGATIVE mg/dL   Protein, ur >454 (*) NEGATIVE mg/dL   Urobilinogen, UA 0.2  0.0 - 1.0 mg/dL   Nitrite NEGATIVE  NEGATIVE   Leukocytes, UA NEGATIVE  NEGATIVE  URINE  MICROSCOPIC-ADD ON      Component Value Range   RBC / HPF TOO NUMEROUS TO COUNT  <3 RBC/hpf   Urine-Other FIELD OBSCURED BY RBC'S    POCT I-STAT, CHEM 8      Component Value Range   Sodium 122 (*) 135 - 145 mEq/L   Potassium 3.5  3.5 - 5.1 mEq/L   Chloride 91 (*) 96 - 112 mEq/L   BUN 12  6 - 23 mg/dL   Creatinine, Ser 0.98  0.50 - 1.35 mg/dL   Glucose, Bld 119 (*) 70 - 99 mg/dL   Calcium, Ion 1.47 (*) 1.13 - 1.30 mmol/L   TCO2 20  0 - 100 mmol/L   Hemoglobin 13.9  13.0 - 17.0 g/dL   HCT 82.9  56.2 - 13.0 %  CBC WITH DIFFERENTIAL      Component Value Range   WBC 11.4 (*) 4.0 - 10.5 K/uL   RBC 3.93 (*) 4.22 - 5.81 MIL/uL   Hemoglobin 13.3  13.0 - 17.0 g/dL   HCT 86.5 (*) 78.4 - 69.6 %   MCV 93.1  78.0 -  100.0 fL   MCH 33.8  26.0 - 34.0 pg   MCHC 36.3 (*) 30.0 - 36.0 g/dL   RDW 16.1  09.6 - 04.5 %   Platelets 250  150 - 400 K/uL   Neutrophils Relative 74  43 - 77 %   Neutro Abs 8.4 (*) 1.7 - 7.7 K/uL   Lymphocytes Relative 17  12 - 46 %   Lymphs Abs 2.0  0.7 - 4.0 K/uL   Monocytes Relative 8  3 - 12 %   Monocytes Absolute 0.9  0.1 - 1.0 K/uL   Eosinophils Relative 0  0 - 5 %   Eosinophils Absolute 0.0  0.0 - 0.7 K/uL   Basophils Relative 0  0 - 1 %   Basophils Absolute 0.0  0.0 - 0.1 K/uL  LACTIC ACID, PLASMA      Component Value Range   Lactic Acid, Venous 2.2  0.5 - 2.2 mmol/L  PHOSPHORUS      Component Value Range   Phosphorus 3.2  2.3 - 4.6 mg/dL  MAGNESIUM      Component Value Range   Magnesium 1.5  1.5 - 2.5 mg/dL  HEMOGLOBIN W0J      Component Value Range   Hemoglobin A1C 6.2 (*) <5.7 %   Mean Plasma Glucose 131 (*) <117 mg/dL  BASIC METABOLIC PANEL      Component Value Range   Sodium 120 (*) 135 - 145 mEq/L   Potassium 3.5  3.5 - 5.1 mEq/L   Chloride 87 (*) 96 - 112 mEq/L   CO2 25  19 - 32 mEq/L   Glucose, Bld 136 (*) 70 - 99 mg/dL   BUN 11  6 - 23 mg/dL   Creatinine, Ser 8.11  0.50 - 1.35 mg/dL   Calcium 8.1 (*) 8.4 - 10.5 mg/dL   GFR calc non Af Amer  85 (*) >90 mL/min   GFR calc Af Amer >90  >90 mL/min  CBC      Component Value Range   WBC 9.7  4.0 - 10.5 K/uL   RBC 3.58 (*) 4.22 - 5.81 MIL/uL   Hemoglobin 12.2 (*) 13.0 - 17.0 g/dL   HCT 91.4 (*) 78.2 - 95.6 %   MCV 93.0  78.0 - 100.0 fL   MCH 34.1 (*) 26.0 - 34.0 pg   MCHC 36.6 (*) 30.0 - 36.0 g/dL   RDW 21.3  08.6 - 57.8 %   Platelets 228  150 - 400 K/uL  TSH      Component Value Range   TSH 1.409  0.350 - 4.500 uIU/mL  GLUCOSE, CAPILLARY      Component Value Range   Glucose-Capillary 111 (*) 70 - 99 mg/dL   Comment 1 Notify RN       Signed: Meisha Salone,Emmanual NEVILL 09/30/2011, 1:18 PM

## 2011-09-30 NOTE — Consult Note (Signed)
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 A Foley catheter was then inserted in the bladder. The catheter has been draining grossly clear urine. He had seeds implantation about 11 years ago in [place]. He had a previous episode of gross hematuria 2-3 years after seed implantation. It cleared up on its own. He reports that he has been performing well with a good flow it yesterday afternoon when he started passing blood and blood clots and had difficulty voiding. He states that his PSA has been 0.01. CT scan without IV contrast revealed  normal kidneys, no renal or ureteral calculi.  Past Medical History  Diagnosis Date  . Cancer     prostate with seeds  . Coronary artery disease   . Diabetes mellitus    Past Surgical History  Procedure Date  . Coronary artery bypass graft   . Hernia repair     Medications: Albuterol, Pantoprazole, Phenazopyridine. Allergies:  Allergies  Allergen Reactions  . Sulfa Antibiotics Hives    No family history on file. Social History:  reports that he has never smoked. He does not have any smokeless tobacco history on file. He reports that he drinks alcohol. He reports that he does not use illicit drugs.  ROS: All systems are reviewed and negative except as noted.   Physical Exam:  Vital signs in last 24 hours: Temp:  [96.6 F (35.9 C)-98.4 F (36.9 C)] 98.4 F (36.9 C) (08/15 0601) Pulse Rate:  [50-66] 51  (08/15 0601) Resp:  [17-18] 18  (08/15 0601) BP: (122-166)/(59-82) 126/75 mmHg (08/15 0601) SpO2:  [97 %-100 %] 97 % (08/15 0601) Weight:  [161 lb (73.029 kg)-176 lb 12.9 oz (80.2 kg)] 176 lb 12.9 oz (80.2 kg) (08/15 0601)  Ears nose and  throat: Within normal limits Neck supple. No cervical adenopathy, no thyromegaly Cardiovascular: Skin warm; not flushed Respiratory: Breaths quiet; no shortness of breath Abdomen: No masses Neurological: Normal sensation to touch Musculoskeletal: Normal motor function arms and legs Lymphatics: No inguinal adenopathy Skin: No rashes Genitourinary: Penis is circumcised. He has an indwelling Foley catheter that is draining clear urine. The scrotum is normal in appearance.                         The testicles are normal. There is no testicular mass. Rectal examination: Patient refused digital rectal examination at this time.  Laboratory Data:  Results for orders placed during the hospital encounter of 09/29/11 (from the past 72 hour(s))  URINALYSIS, ROUTINE W REFLEX MICROSCOPIC     Status: Abnormal   Collection Time   09/29/11  9:30 PM      Component Value Range Comment   Color, Urine YELLOW  YELLOW    APPearance CLOUDY (*) CLEAR    Specific Gravity, Urine 1.020  1.005 - 1.030    pH 6.0  5.0 - 8.0    Glucose, UA NEGATIVE  NEGATIVE mg/dL    Hgb urine dipstick LARGE (*) NEGATIVE    Bilirubin Urine NEGATIVE  NEGATIVE    Ketones, ur 40 (*) NEGATIVE mg/dL    Protein, ur >409 (*) NEGATIVE mg/dL    Urobilinogen, UA 0.2  0.0 - 1.0  mg/dL    Nitrite NEGATIVE  NEGATIVE    Leukocytes, UA NEGATIVE  NEGATIVE   URINE MICROSCOPIC-ADD ON     Status: Normal   Collection Time   09/29/11  9:30 PM      Component Value Range Comment   RBC / HPF TOO NUMEROUS TO COUNT  <3 RBC/hpf    Urine-Other FIELD OBSCURED BY RBC'S     POCT I-STAT, CHEM 8     Status: Abnormal   Collection Time   09/29/11 11:06 PM      Component Value Range Comment   Sodium 122 (*) 135 - 145 mEq/L    Potassium 3.5  3.5 - 5.1 mEq/L    Chloride 91 (*) 96 - 112 mEq/L    BUN 12  6 - 23 mg/dL    Creatinine, Ser 5.62  0.50 - 1.35 mg/dL    Glucose, Bld 130 (*) 70 - 99 mg/dL    Calcium, Ion 8.65 (*) 1.13 - 1.30 mmol/L    TCO2 20  0 - 100  mmol/L    Hemoglobin 13.9  13.0 - 17.0 g/dL    HCT 78.4  69.6 - 29.5 %   CBC WITH DIFFERENTIAL     Status: Abnormal   Collection Time   09/29/11 11:36 PM      Component Value Range Comment   WBC 11.4 (*) 4.0 - 10.5 K/uL    RBC 3.93 (*) 4.22 - 5.81 MIL/uL    Hemoglobin 13.3  13.0 - 17.0 g/dL    HCT 28.4 (*) 13.2 - 52.0 %    MCV 93.1  78.0 - 100.0 fL    MCH 33.8  26.0 - 34.0 pg    MCHC 36.3 (*) 30.0 - 36.0 g/dL    RDW 44.0  10.2 - 72.5 %    Platelets 250  150 - 400 K/uL    Neutrophils Relative 74  43 - 77 %    Neutro Abs 8.4 (*) 1.7 - 7.7 K/uL    Lymphocytes Relative 17  12 - 46 %    Lymphs Abs 2.0  0.7 - 4.0 K/uL    Monocytes Relative 8  3 - 12 %    Monocytes Absolute 0.9  0.1 - 1.0 K/uL    Eosinophils Relative 0  0 - 5 %    Eosinophils Absolute 0.0  0.0 - 0.7 K/uL    Basophils Relative 0  0 - 1 %    Basophils Absolute 0.0  0.0 - 0.1 K/uL   LACTIC ACID, PLASMA     Status: Normal   Collection Time   09/30/11 12:38 AM      Component Value Range Comment   Lactic Acid, Venous 2.2  0.5 - 2.2 mmol/L   PHOSPHORUS     Status: Normal   Collection Time   09/30/11  5:28 AM      Component Value Range Comment   Phosphorus 3.2  2.3 - 4.6 mg/dL   MAGNESIUM     Status: Normal   Collection Time   09/30/11  5:28 AM      Component Value Range Comment   Magnesium 1.5  1.5 - 2.5 mg/dL   BASIC METABOLIC PANEL     Status: Abnormal   Collection Time   09/30/11  5:28 AM      Component Value Range Comment   Sodium 120 (*) 135 - 145 mEq/L    Potassium 3.5  3.5 - 5.1 mEq/L    Chloride 87 (*) 96 -  112 mEq/L    CO2 25  19 - 32 mEq/L    Glucose, Bld 136 (*) 70 - 99 mg/dL    BUN 11  6 - 23 mg/dL    Creatinine, Ser 8.29  0.50 - 1.35 mg/dL    Calcium 8.1 (*) 8.4 - 10.5 mg/dL    GFR calc non Af Amer 85 (*) >90 mL/min    GFR calc Af Amer >90  >90 mL/min   CBC     Status: Abnormal   Collection Time   09/30/11  5:28 AM      Component Value Range Comment   WBC 9.7  4.0 - 10.5 K/uL    RBC 3.58 (*) 4.22  - 5.81 MIL/uL    Hemoglobin 12.2 (*) 13.0 - 17.0 g/dL    HCT 56.2 (*) 13.0 - 52.0 %    MCV 93.0  78.0 - 100.0 fL    MCH 34.1 (*) 26.0 - 34.0 pg    MCHC 36.6 (*) 30.0 - 36.0 g/dL    RDW 86.5  78.4 - 69.6 %    Platelets 228  150 - 400 K/uL    No results found for this or any previous visit (from the past 240 hour(s)). Creatinine:  Basename 09/30/11 0528 09/29/11 2306  CREATININE 0.79 1.00    Xrays: I have independently reviewed the CT scan and the findings are as noted in history of present illness.  Impression/Assessment:  Gross hematuria History of prostate cancer Hyponatremia  Plan:   Since the urine is clear and patient did not have any voiding symptoms prior to this episode of gross hematuria, We will remove the Foley catheter for voiding trial. He needs cystoscopy. However the patient doesn't want to have any procedure done at this time. He would like to get things settled down before considering the procedure. He states that he will call the office for followup.  Javaya [place]-HENRY 09/30/2011, 8:42 AM

## 2011-09-30 NOTE — Progress Notes (Signed)
Subjective: [name] slept okay last night.  He does have a catheter in his urine appears clear today.  No further nausea and vomiting.  He did hydrate very aggressively yesterday but after he saw the gross blood in his urine and clots because she did not want to have any urinary obstruction from the clots.  He has had no dysuria fevers or chills or pain and he did have an episode of painless gross hematuria about 8 years ago, 3 years after he had seed implants for prostate cancer.  This resolved spontaneously.  He currently has a catheter in and I will have urology come by to see him and repeat a sodium level later this morning.  Hopefully he can be discharged later today or tomorrow morning.  I am wondering if he may need cystoscopy  Objective: Weight change:   Intake/Output Summary (Last 24 hours) at 09/30/11 0718 Last data filed at 09/30/11 0602  Gross per 24 hour  Intake 116.25 ml  Output    200 ml  Net -83.75 ml   Filed Vitals:   09/30/11 0034 09/30/11 0358 09/30/11 0418 09/30/11 0601  BP:  137/69 122/59 126/75  Pulse:  57 50 51  Temp: 96.6 F (35.9 C) 97.6 F (36.4 C) 97.8 F (36.6 C) 98.4 F (36.9 C)  TempSrc: Rectal  Oral Oral  Resp:   17 18  Height:    5\' 9"  (1.753 m)  Weight:    80.2 kg (176 lb 12.9 oz)  SpO2:  99% 97% 97%    General Appearance: Alert, cooperative, no distress, appears stated age Head: Normocephalic, without obvious abnormality, atraumatic Neck: Supple, symmetrical Lungs: Clear to auscultation bilaterally, respirations unlabored Heart: Regular rate and rhythm, S1 and S2 normal, no murmur, rub or gallop Abdomen: Soft, non-tender, bowel sounds active all four quadrants, no masses, no organomegaly.  Foley catheter in place with clear urine Extremities: Extremities normal, atraumatic, no cyanosis or edema Pulses: 2+ and symmetric all extremities Skin: Skin color, texture, turgor normal, no rashes or lesions Neuro: CNII-XII intact. Normal strength,  sensation and reflexes throughout   Lab Results:  Basename 09/30/11 0528 09/29/11 2306  NA 120* 122*  K 3.5 3.5  CL 87* 91*  CO2 25 --  GLUCOSE 136* 122*  BUN 11 12  CREATININE 0.79 1.00  CALCIUM 8.1* --  MG 1.5 --  PHOS 3.2 --   No results found for this basename: AST:2,ALT:2,ALKPHOS:2,BILITOT:2,PROT:2,ALBUMIN:2 in the last 72 hours No results found for this basename: LIPASE:2,AMYLASE:2 in the last 72 hours  Basename 09/30/11 0528 09/29/11 2336  WBC 9.7 11.4*  NEUTROABS -- 8.4*  HGB 12.2* 13.3  HCT 33.3* 36.6*  MCV 93.0 93.1  PLT 228 250   No results found for this basename: CKTOTAL:3,CKMB:3,CKMBINDEX:3,TROPONINI:3 in the last 72 hours No components found with this basename: POCBNP:3 No results found for this basename: DDIMER:2 in the last 72 hours No results found for this basename: HGBA1C:2 in the last 72 hours No results found for this basename: CHOL:2,HDL:2,LDLCALC:2,TRIG:2,CHOLHDL:2,LDLDIRECT:2 in the last 72 hours No results found for this basename: TSH,T4TOTAL,FREET3,T3FREE,THYROIDAB in the last 72 hours No results found for this basename: VITAMINB12:2,FOLATE:2,FERRITIN:2,TIBC:2,IRON:2,RETICCTPCT:2 in the last 72 hours  Studies/Results: Ct Abdomen Pelvis Wo Contrast  09/30/2011  *RADIOLOGY REPORT*  Clinical Data: Hematuria, history nephrolithiasis.  CT ABDOMEN AND PELVIS WITHOUT CONTRAST  Technique:  Multidetector CT imaging of the abdomen and pelvis was performed following the standard protocol without intravenous contrast.  Comparison: 06/16/2001 by report only  Findings:  Patchy infiltrate, atelectasis or scarring posteriorly in the visualized lower lobes, right greater than left.  Coronary and aortic calcifications.  Unremarkable uninfused evaluation of liver, nondistended gallbladder, spleen, adrenal glands kidneys.  No nephrolithiasis or hydronephrosis.  Ureters are decompressed without calculus.  Urinary bladder decompressed by Foley catheter. Multiple metallic  seeds in the region of the prostate.  Mild pancreatic parenchymal atrophy.  Stomach and small bowel are nondistended.  Appendix not discretely identified.  The colon is nondilated, unremarkable.  No ascites.  No free air.  Patchy aortoiliac arterial calcifications without aneurysm.  No adenopathy localized.  Spondylitic changes in the lumbar spine. 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  IMPRESSION: 1.  Negative for nephrolithiasis, ureteral calculus, hydronephrosis, or other acute abnormality. 2. Atelectasis or infiltrate posteriorly in the visualized lung bases, right greater than left. 3. Atherosclerosis, including . coronary artery disease. Please note that although the presence of coronary artery calcium documents the presence of coronary artery disease, the severity of this disease and any potential stenosis cannot be assessed on this non-gated CT examination.  Assessment for potential risk factor modification, dietary therapy or pharmacologic therapy may be warranted, if clinically indicated.  Original Report Authenticated By: Osa Craver, M.D.   Medications: Scheduled Meds:   . aspirin EC  81 mg Oral Daily  . atorvastatin  40 mg Oral Daily  . cefTRIAXone (ROCEPHIN)  IV  1 g Intravenous Q24H  . enoxaparin (LOVENOX) injection  40 mg Subcutaneous Q24H  . fentaNYL  50 mcg Intravenous Once  . HYDROcodone-acetaminophen  1 tablet Oral Once  . LORazepam      . metFORMIN  500 mg Oral Q supper  . ondansetron (ZOFRAN) IV  4 mg Intravenous Once  . pantoprazole  40 mg Oral Daily  . phenazopyridine  100 mg Oral TID WC  . sodium chloride  1,000 mL Intravenous Once   Continuous Infusions:   . sodium chloride 75 mL/hr at 09/30/11 0327   PRN Meds:.albuterol, HYDROcodone-acetaminophen, morphine injection, ondansetron (ZOFRAN) IV, ondansetron  Assessment/Plan: Patient Active Problem List   Diagnosis Date Noted  . Hematuria - gross hematuria is clearing.  We'll have  urology consult  09/30/2011  . Hyponatremia - sodium is actually slightly lower today.  Will repeat sodium at 1 PM instead of 11 AM  09/30/2011    09/30/2011  . Leukocytosis - improved.  Likely secondary to stress  09/30/2011  . Proteinuria - proteinuria is likely secondary to blood in the urine.  Repeat urinalysis.  Blood appears to be clearing  Coronary artery disease  - stable  Diabetes mellitus - okay - may have to hold metformin for procedures  09/30/2011     LOS: 1 day   [name],Tivis NEVILL 09/30/2011, 7:18 AM

## 2011-09-30 NOTE — H&P (Signed)
[facility] History and Physical  [name] AVW:[id number] DOB: [birth year] DOA: 09/29/2011  Referring physician: ED physician PCP: Pearla Dubonnet, MD   Chief Complaint: Hematuria  HPI:  Pt is 78 yo male who presents with history of diabetes, hypertension, hyperlipidemia, CAD, prostate cancer status post seeding presents wit new onset hematuria that he noticed 1-2 days prior to admission associated with dysuria and urinary frequency. Patient reports history of similar symptoms many years ago following his prostate seeding. Pt denies fevers, chills, any specific abdominal concerns, no chest pain or shortness of breath. He had one episode of non bloody vomiting in ED following percocet tablet. Hospitalist team asked to admit for observation.  Assessment and Plan: Principal Problem:  *Hematuria - unclear etiology at this time but certainly worrisome given history of prostate cancer - CT abdomen and pelvis unremarkable for acute urologic etiologies - will admit for observation and will notify PCP - supportive care for now with IVF, analgesia as needed - will add antibiotic as pt has mild leukocytosis and his symptoms are somewhat consistent with an acute infection - it is possible that his urinary frequency is likely from more oral hydration he tried at home and his dysuria is in the setting of hematuria rather than the infectious etiology - will need to follow up on urine culture and decide if continuation of ABX indicated   Active Problems:  Hyponatremia - likely of pre renal etiology - will hydrate and will check BMP in AM   Proteinuria - unclear etiology but needs to be followed   Acute on chronic renal failure - creatinine is stable and within normal limits   Leukocytosis - this is likely reactive and I ma not sure that it is related to the actual infection - we can treat with empiric antibiotic for now and once urine culture back we can readjust the regimen as  indicated   DM, type II - will check A1C - continue home medication regimen  Code Status: Full Family Communication: Pt at bedside Disposition Plan: PT evaluation   Review of Systems:  Constitutional: Negative for fever, chills and malaise/fatigue. Negative for diaphoresis.  HENT: Negative for hearing loss, ear pain, nosebleeds, congestion, sore throat, neck pain, tinnitus and ear discharge.   Eyes: Negative for blurred vision, double vision, photophobia, pain, discharge and redness.  Respiratory: Negative for cough, hemoptysis, sputum production, shortness of breath, wheezing and stridor.   Cardiovascular: Negative for chest pain, palpitations, orthopnea, claudication and leg swelling.  Gastrointestinal: Positive for nausea, vomiting, negative for abdominal pain. Negative for heartburn, constipation, blood in stool and melena.  Genitourinary: Negative for dysuria, urgency, frequency, positive for hematuria.  Musculoskeletal: Negative for myalgias, back pain, joint pain and falls.  Skin: Negative for itching and rash.  Neurological: Negative for dizziness, positive for generalized weakness. Negative for tingling, tremors, sensory change, speech change, focal weakness, loss of consciousness and headaches.  Endo/Heme/Allergies: Negative for environmental allergies and polydipsia. Does not bruise/bleed easily.  Psychiatric/Behavioral: Negative for suicidal ideas. The patient is not nervous/anxious.      Past Medical History  Diagnosis Date  . Cancer     prostate with seeds  . Coronary artery disease   . Diabetes mellitus     Past Surgical History  Procedure Date  . Coronary artery bypass graft   . Hernia repair     Social History:  reports that he has never smoked. He does not have any smokeless tobacco history on file. He reports that he  drinks alcohol. He reports that he does not use illicit drugs.  Allergies  Allergen Reactions  . Sulfa Antibiotics Hives    No family  history of cancers, no heart disease  Medication Sig  albuterol  inhaler Inhale 2 puffs every 6 (six) hours as needed  aspirin EC 81 MG tablet Take 81 mg by mouth daily.  atorvastatin (LIPITOR) 40 MG tablet Take 40 mg by mouth daily.  metFORMIN  500 MG tablet Take 500 mg by mouth at bedtime.  pantoprazole (PROTONIX) 40 MG tablet Take 40 mg by mouth daily.   Physical Exam: Filed Vitals:   09/29/11 2052 09/29/11 2147 09/30/11 0034  BP:  166/82   Pulse: 56 66   Temp: 97.9 F (36.6 C)  96.6 F (35.9 C)  TempSrc: Oral  Rectal  Resp: 18    Height: 5\' 9"  (1.753 m)    Weight: 73.029 kg (161 lb)    SpO2: 100% 99%     Physical Exam  Constitutional: Appears well-developed and well-nourished. No distress.  HENT: Normocephalic. External right and left ear normal. Oropharynx is clear and moist.  Eyes: Conjunctivae and EOM are normal. PERRLA, no scleral icterus.  Neck: Normal ROM. Neck supple. No JVD. No tracheal deviation. No thyromegaly.  CVS: RRR, S1/S2 +, no murmurs, no gallops, no carotid bruit.  Pulmonary: Effort and breath sounds normal, no stridor, rhonchi, wheezes, rales.  Abdominal: Soft. BS +,  no distension, tenderness, rebound or guarding.  Musculoskeletal: Normal range of motion. No edema and no tenderness.  Lymphadenopathy: No lymphadenopathy noted, cervical, inguinal. Neuro: Alert. Normal reflexes, muscle tone coordination. No cranial nerve deficit. Skin: Skin is warm and dry. No rash noted. Not diaphoretic. No erythema. No pallor.  Psychiatric: Normal mood and affect. Behavior, judgment, thought content normal.   Labs on Admission:  Basic Metabolic Panel:  Lab 09/29/11 4696  NA 122*  K 3.5  CL 91*  CO2 --  GLUCOSE 122*  BUN 12  CREATININE 1.00  CALCIUM --  MG --  PHOS --   CBC:  Lab 09/29/11 2336 09/29/11 2306  WBC 11.4* --  NEUTROABS 8.4* --  HGB 13.3 13.9  HCT 36.6* 41.0  MCV 93.1 --  PLT 250 --    Radiological Exams on Admission: Ct Abdomen Pelvis  Wo Contrast 09/30/2011  MPRESSION:  1.  Negative for nephrolithiasis, ureteral calculus, hydronephrosis, or other acute abnormality.  2. Atelectasis or infiltrate posteriorly in the visualized lung bases, right greater than left.  3. Atherosclerosis, including . coronary artery disease. Please note that although the presence of coronary artery calcium documents the presence of coronary artery disease, the severity of this disease and any potential stenosis cannot be assessed on this non-gated CT examination.  Assessment for potential risk factor modification, dietary therapy or pharmacologic therapy may be warranted, if clinically indicated.   EKG: Normal sinus rhythm, no ST/T wave changes  Debbora Presto, MD  [facility] Pager [phone]  If 7PM-7AM, please contact night-coverage [email or web address] Password [email or web address] 09/30/2011, 3:05 AM

## 2011-09-30 NOTE — Progress Notes (Signed)
Pt is voiding, foley d/c'd. Urine is pink tinged, punch colored. Pt is not experiencing pain and requests MD to be called. [facility] was called to make MD aware of Pt concern. Pt was informed to make RN aware of any pain, or difficulty urinating. Pt is stable and will continue to monitor. Wife at bedside.  MCCLAIN, Shemuel Harkleroad L 09/30/2011 11:43 AM

## 2011-09-30 NOTE — Progress Notes (Signed)
Pt d/c'd home. PT was eager to leave. MD office called and left message with sodium level of 124. Pt stated that he felt better and was ready to go. Encouraged dietary measures to keep up sodium level. Pt stated that he understood and would continue to monitor his urine. Urine was straw colored and his stream was steady. He stated that this happened before (hematuria) and it cleared on its own.  [name], Bradley Petrich L 09/30/2011 2:49 PM

## 2011-10-01 DIAGNOSIS — E871 Hypo-osmolality and hyponatremia: Secondary | ICD-10-CM | POA: Diagnosis not present

## 2011-10-02 LAB — URINE CULTURE

## 2011-10-06 LAB — CULTURE, BLOOD (ROUTINE X 2): Culture: NO GROWTH

## 2011-10-12 DIAGNOSIS — H4720 Unspecified optic atrophy: Secondary | ICD-10-CM | POA: Diagnosis not present

## 2011-10-12 DIAGNOSIS — E119 Type 2 diabetes mellitus without complications: Secondary | ICD-10-CM | POA: Diagnosis not present

## 2011-10-12 DIAGNOSIS — H18839 Recurrent erosion of cornea, unspecified eye: Secondary | ICD-10-CM | POA: Diagnosis not present

## 2011-10-26 DIAGNOSIS — C61 Malignant neoplasm of prostate: Secondary | ICD-10-CM | POA: Diagnosis not present

## 2011-10-26 DIAGNOSIS — R310 Gross hematuria: Secondary | ICD-10-CM | POA: Diagnosis not present

## 2011-11-09 DIAGNOSIS — E782 Mixed hyperlipidemia: Secondary | ICD-10-CM | POA: Diagnosis not present

## 2011-11-09 DIAGNOSIS — I2510 Atherosclerotic heart disease of native coronary artery without angina pectoris: Secondary | ICD-10-CM | POA: Diagnosis not present

## 2011-11-09 DIAGNOSIS — E559 Vitamin D deficiency, unspecified: Secondary | ICD-10-CM | POA: Diagnosis not present

## 2011-11-09 DIAGNOSIS — Z125 Encounter for screening for malignant neoplasm of prostate: Secondary | ICD-10-CM | POA: Diagnosis not present

## 2011-11-09 DIAGNOSIS — E119 Type 2 diabetes mellitus without complications: Secondary | ICD-10-CM | POA: Diagnosis not present

## 2011-11-09 DIAGNOSIS — Z79899 Other long term (current) drug therapy: Secondary | ICD-10-CM | POA: Diagnosis not present

## 2011-11-09 DIAGNOSIS — Z0000 Encounter for general adult medical examination without abnormal findings: Secondary | ICD-10-CM | POA: Diagnosis not present

## 2012-06-07 DIAGNOSIS — Z7189 Other specified counseling: Secondary | ICD-10-CM | POA: Diagnosis not present

## 2012-06-07 DIAGNOSIS — Z792 Long term (current) use of antibiotics: Secondary | ICD-10-CM | POA: Diagnosis not present

## 2012-06-07 DIAGNOSIS — I2510 Atherosclerotic heart disease of native coronary artery without angina pectoris: Secondary | ICD-10-CM | POA: Diagnosis not present

## 2012-06-07 DIAGNOSIS — E782 Mixed hyperlipidemia: Secondary | ICD-10-CM | POA: Diagnosis not present

## 2012-06-07 DIAGNOSIS — Z1331 Encounter for screening for depression: Secondary | ICD-10-CM | POA: Diagnosis not present

## 2012-06-07 DIAGNOSIS — E559 Vitamin D deficiency, unspecified: Secondary | ICD-10-CM | POA: Diagnosis not present

## 2012-06-07 DIAGNOSIS — E119 Type 2 diabetes mellitus without complications: Secondary | ICD-10-CM | POA: Diagnosis not present

## 2012-06-07 DIAGNOSIS — R030 Elevated blood-pressure reading, without diagnosis of hypertension: Secondary | ICD-10-CM | POA: Diagnosis not present

## 2012-06-07 DIAGNOSIS — Z79899 Other long term (current) drug therapy: Secondary | ICD-10-CM | POA: Diagnosis not present

## 2012-06-07 DIAGNOSIS — Z8546 Personal history of malignant neoplasm of prostate: Secondary | ICD-10-CM | POA: Diagnosis not present

## 2012-06-07 DIAGNOSIS — Z0000 Encounter for general adult medical examination without abnormal findings: Secondary | ICD-10-CM | POA: Diagnosis not present

## 2012-06-07 DIAGNOSIS — C61 Malignant neoplasm of prostate: Secondary | ICD-10-CM | POA: Diagnosis not present

## 2012-06-20 DIAGNOSIS — C61 Malignant neoplasm of prostate: Secondary | ICD-10-CM | POA: Diagnosis not present

## 2012-07-19 DIAGNOSIS — K625 Hemorrhage of anus and rectum: Secondary | ICD-10-CM | POA: Diagnosis not present

## 2012-07-19 DIAGNOSIS — R040 Epistaxis: Secondary | ICD-10-CM | POA: Diagnosis not present

## 2012-07-19 DIAGNOSIS — K648 Other hemorrhoids: Secondary | ICD-10-CM | POA: Diagnosis not present

## 2012-10-10 DIAGNOSIS — D485 Neoplasm of uncertain behavior of skin: Secondary | ICD-10-CM | POA: Diagnosis not present

## 2012-10-10 DIAGNOSIS — L570 Actinic keratosis: Secondary | ICD-10-CM | POA: Diagnosis not present

## 2012-10-10 DIAGNOSIS — E119 Type 2 diabetes mellitus without complications: Secondary | ICD-10-CM | POA: Diagnosis not present

## 2012-10-10 DIAGNOSIS — H4720 Unspecified optic atrophy: Secondary | ICD-10-CM | POA: Diagnosis not present

## 2012-10-10 DIAGNOSIS — Z85828 Personal history of other malignant neoplasm of skin: Secondary | ICD-10-CM | POA: Diagnosis not present

## 2012-10-10 DIAGNOSIS — H18839 Recurrent erosion of cornea, unspecified eye: Secondary | ICD-10-CM | POA: Diagnosis not present

## 2012-10-10 DIAGNOSIS — L821 Other seborrheic keratosis: Secondary | ICD-10-CM | POA: Diagnosis not present

## 2012-10-10 DIAGNOSIS — D0439 Carcinoma in situ of skin of other parts of face: Secondary | ICD-10-CM | POA: Diagnosis not present

## 2012-10-18 IMAGING — CT CT ABD-PELV W/O CM
1 series · 15 of 23 positions shown, 19 images · non-contrast
Comparison: 06/16/2001 by report only

CLINICAL DATA: Hematuria, history nephrolithiasis.

CT ABDOMEN AND PELVIS WITHOUT CONTRAST
TECHNIQUE: Multidetector CT imaging of the abdomen and pelvis was
performed following the standard protocol without intravenous
contrast.

[Series 3: lung · axial · 0.74mm/px · z∈[-272,-172]mm · 15 of 23 slices shown, 19 images]
[im 2/23  soft-tissue]
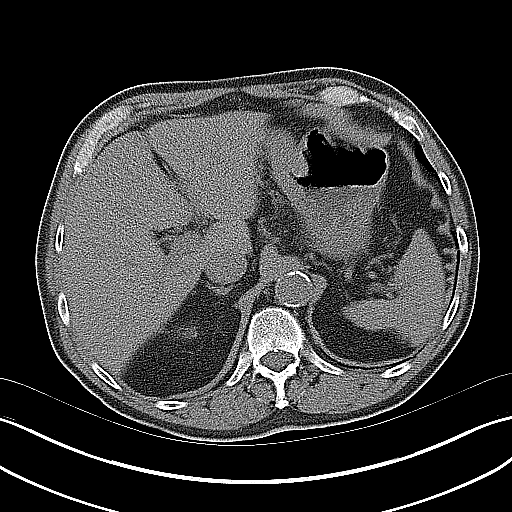
[im 2/23  bone]
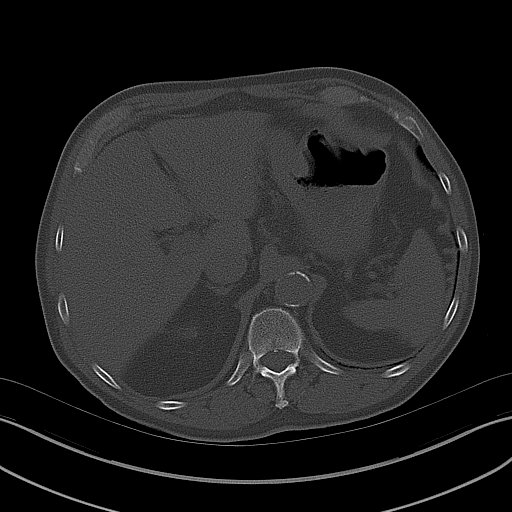
[im 4/23  soft-tissue]
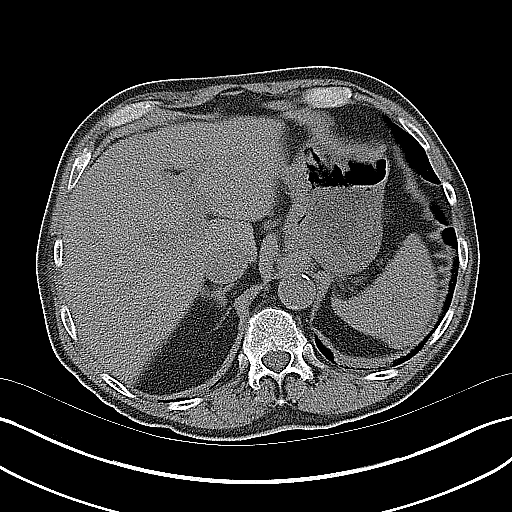
[im 6/23  soft-tissue]
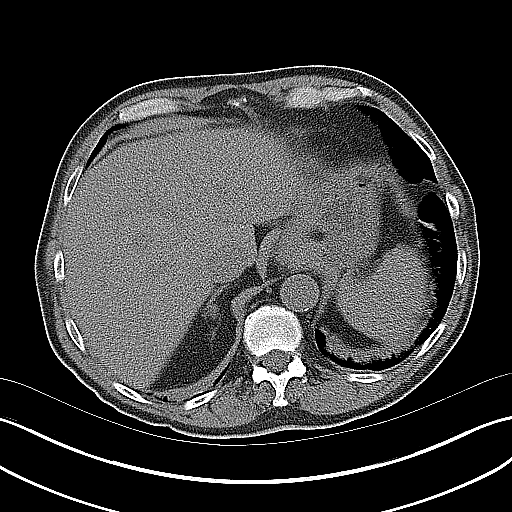
[im 7/23  soft-tissue]
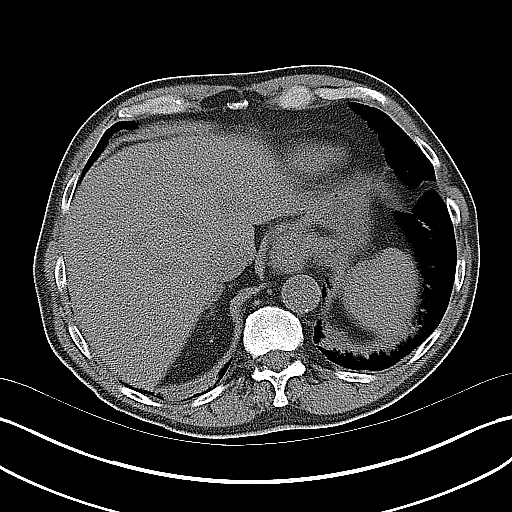
[im 9/23  soft-tissue]
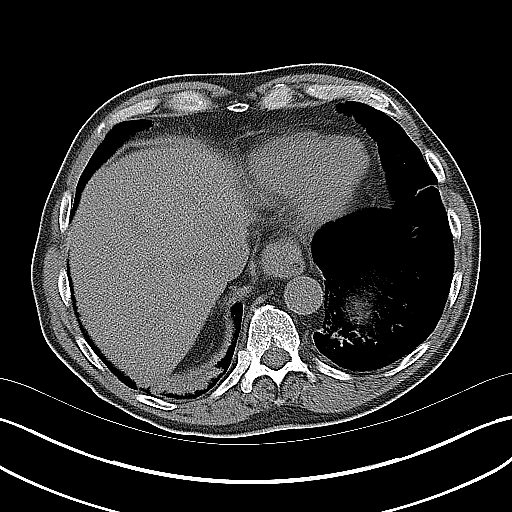
[im 10/23  soft-tissue]
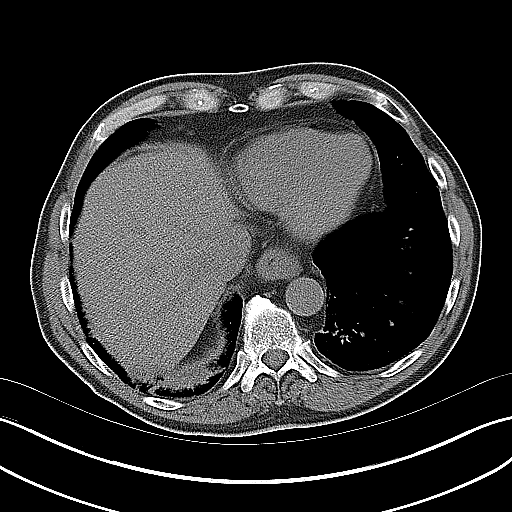
[im 12/23  soft-tissue]
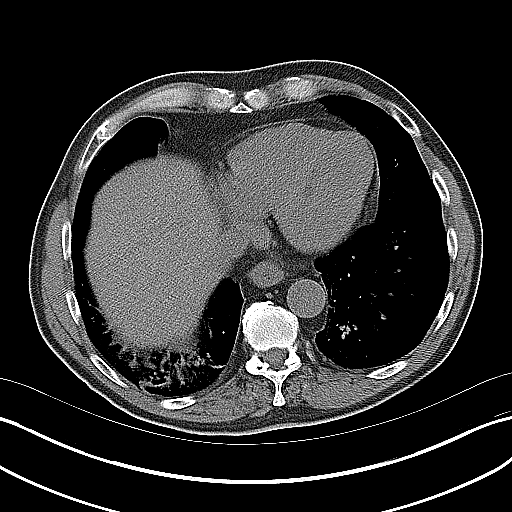
[im 14/23  soft-tissue]
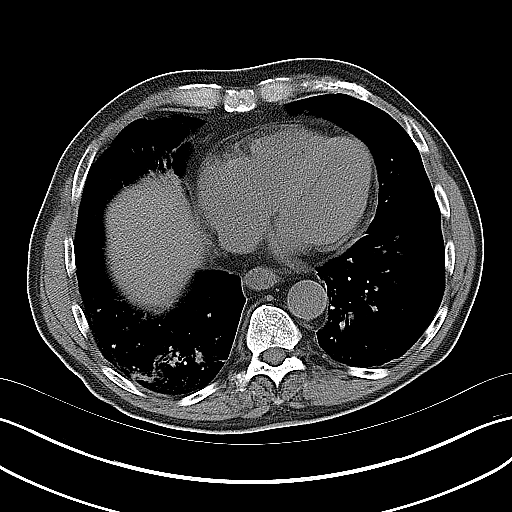
[im 15/23  soft-tissue]
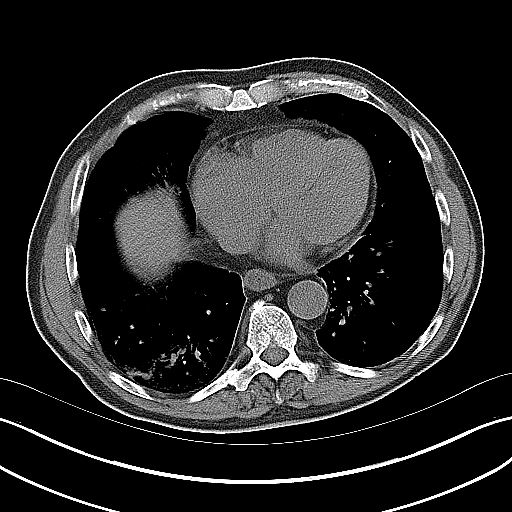
[im 15/23  bone]
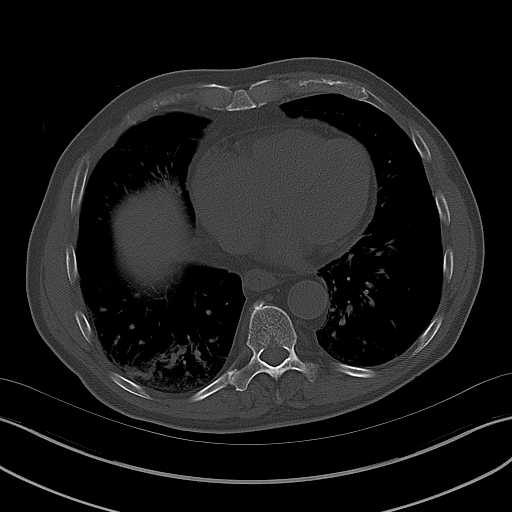
[im 17/23  soft-tissue]
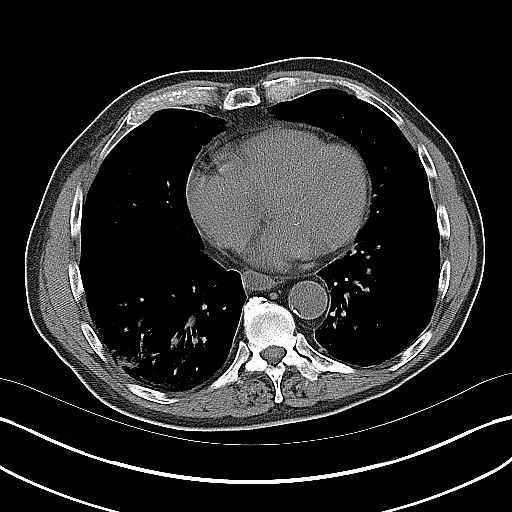
[im 18/23  soft-tissue]
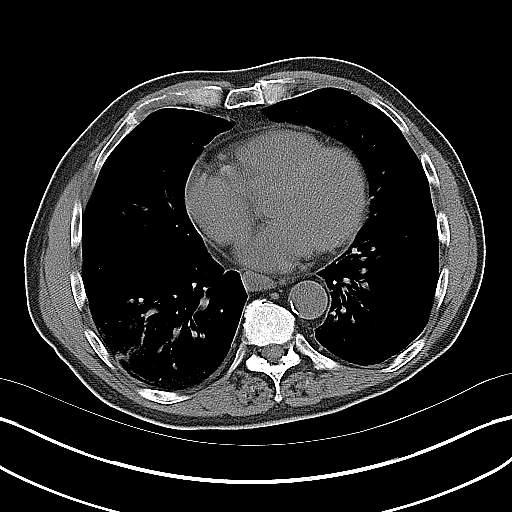
[im 19/23  lung]
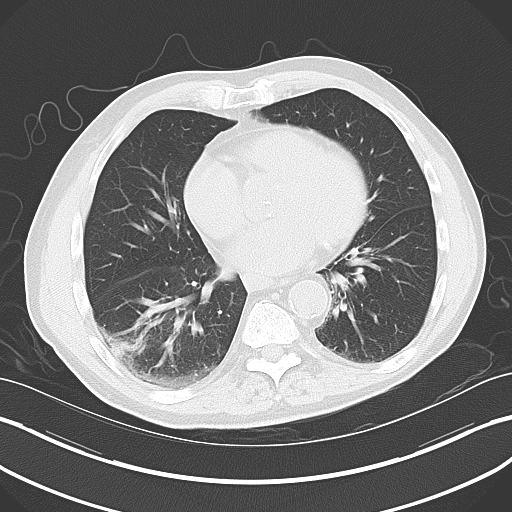
[im 20/23  soft-tissue]
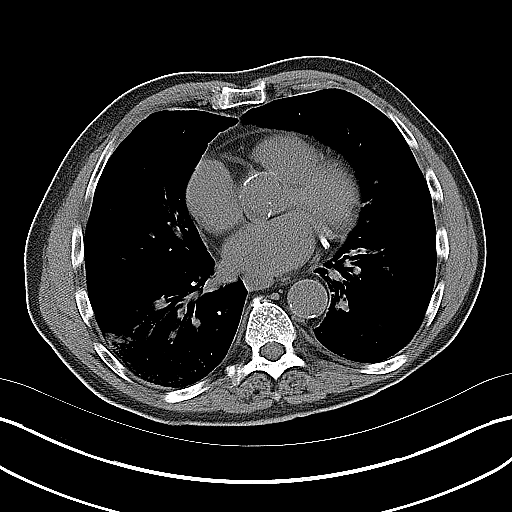
[im 20/23  lung]
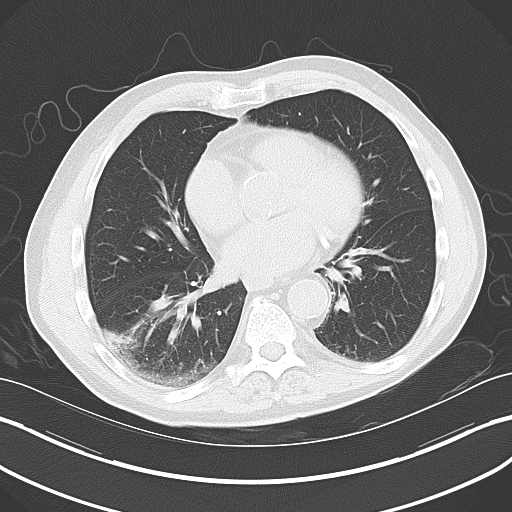
[im 21/23  lung]
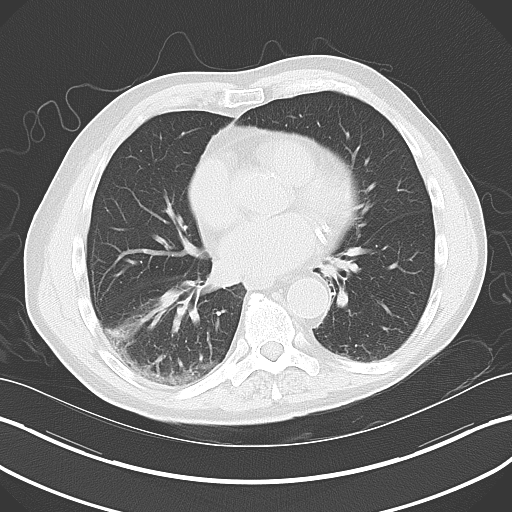
[im 22/23  soft-tissue]
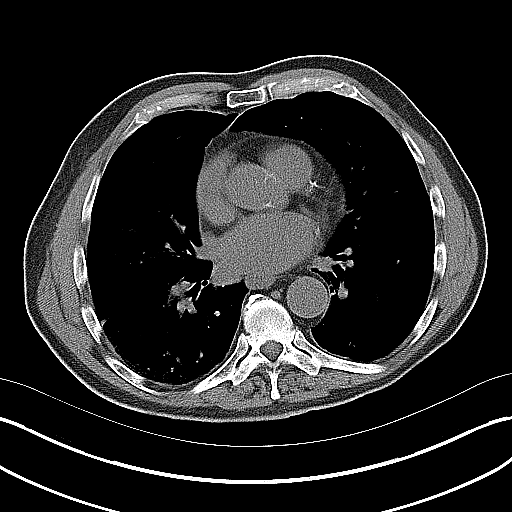
[im 22/23  lung]
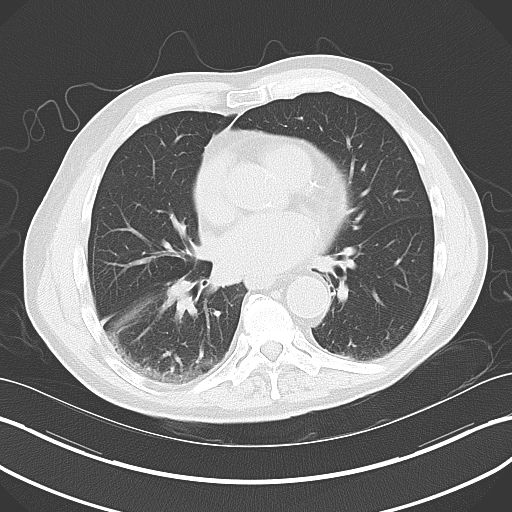

[15 of 23 positions shown; findings below may reference images not displayed]

FINDINGS: Patchy infiltrate, atelectasis or scarring posteriorly in
the visualized lower lobes, right greater than left.  Coronary and
aortic calcifications.  Unremarkable uninfused evaluation of liver,
nondistended gallbladder, spleen, adrenal glands kidneys.  No
nephrolithiasis or hydronephrosis.  Ureters are decompressed
without calculus.  Urinary bladder decompressed by Foley catheter.
Multiple metallic seeds in the region of the prostate.  Mild
pancreatic parenchymal atrophy.  Stomach and small bowel are
nondistended.  Appendix not discretely identified.  The colon is
nondilated, unremarkable.  No ascites.  No free air.  Patchy
aortoiliac arterial calcifications without aneurysm.  No adenopathy
localized.  Spondylitic changes in the lumbar spine. There is a
left-sided infrarenal IVC, an anatomic variant.
IMPRESSION: 1.  Negative for nephrolithiasis, ureteral calculus,
hydronephrosis, or other acute abnormality.
2. Atelectasis or infiltrate posteriorly in the visualized lung
bases, right greater than left.
3. Atherosclerosis, including [...] coronary artery disease. Please
note that although the presence of coronary artery calcium
documents the presence of coronary artery disease, the severity of
this disease and any potential stenosis cannot be assessed on this
non-gated CT examination.  Assessment for potential risk factor
modification, dietary therapy or pharmacologic therapy may be
warranted, if clinically indicated.

## 2012-12-13 DIAGNOSIS — D649 Anemia, unspecified: Secondary | ICD-10-CM | POA: Diagnosis not present

## 2012-12-13 DIAGNOSIS — E782 Mixed hyperlipidemia: Secondary | ICD-10-CM | POA: Diagnosis not present

## 2012-12-13 DIAGNOSIS — Z79899 Other long term (current) drug therapy: Secondary | ICD-10-CM | POA: Diagnosis not present

## 2012-12-13 DIAGNOSIS — E559 Vitamin D deficiency, unspecified: Secondary | ICD-10-CM | POA: Diagnosis not present

## 2012-12-13 DIAGNOSIS — E119 Type 2 diabetes mellitus without complications: Secondary | ICD-10-CM | POA: Diagnosis not present

## 2013-01-16 DIAGNOSIS — R498 Other voice and resonance disorders: Secondary | ICD-10-CM | POA: Diagnosis not present

## 2013-04-11 DIAGNOSIS — I259 Chronic ischemic heart disease, unspecified: Secondary | ICD-10-CM | POA: Diagnosis not present

## 2013-04-11 DIAGNOSIS — E785 Hyperlipidemia, unspecified: Secondary | ICD-10-CM | POA: Diagnosis not present

## 2013-04-11 DIAGNOSIS — Z85828 Personal history of other malignant neoplasm of skin: Secondary | ICD-10-CM | POA: Diagnosis not present

## 2013-04-11 DIAGNOSIS — L821 Other seborrheic keratosis: Secondary | ICD-10-CM | POA: Diagnosis not present

## 2013-04-11 DIAGNOSIS — D126 Benign neoplasm of colon, unspecified: Secondary | ICD-10-CM | POA: Diagnosis not present

## 2013-04-11 DIAGNOSIS — E119 Type 2 diabetes mellitus without complications: Secondary | ICD-10-CM | POA: Diagnosis not present

## 2013-04-11 DIAGNOSIS — L570 Actinic keratosis: Secondary | ICD-10-CM | POA: Diagnosis not present

## 2013-04-11 DIAGNOSIS — I10 Essential (primary) hypertension: Secondary | ICD-10-CM | POA: Diagnosis not present

## 2013-04-11 DIAGNOSIS — L723 Sebaceous cyst: Secondary | ICD-10-CM | POA: Diagnosis not present

## 2013-04-18 DIAGNOSIS — E785 Hyperlipidemia, unspecified: Secondary | ICD-10-CM | POA: Diagnosis not present

## 2013-04-18 DIAGNOSIS — Z86010 Personal history of colonic polyps: Secondary | ICD-10-CM | POA: Diagnosis not present

## 2013-04-18 DIAGNOSIS — Z9889 Other specified postprocedural states: Secondary | ICD-10-CM | POA: Diagnosis not present

## 2013-04-18 DIAGNOSIS — I2510 Atherosclerotic heart disease of native coronary artery without angina pectoris: Secondary | ICD-10-CM | POA: Diagnosis not present

## 2013-04-18 DIAGNOSIS — Z1211 Encounter for screening for malignant neoplasm of colon: Secondary | ICD-10-CM | POA: Diagnosis not present

## 2013-04-18 DIAGNOSIS — E119 Type 2 diabetes mellitus without complications: Secondary | ICD-10-CM | POA: Diagnosis not present

## 2013-04-18 DIAGNOSIS — D126 Benign neoplasm of colon, unspecified: Secondary | ICD-10-CM | POA: Diagnosis not present

## 2013-06-13 DIAGNOSIS — R498 Other voice and resonance disorders: Secondary | ICD-10-CM | POA: Diagnosis not present

## 2013-06-13 DIAGNOSIS — Z23 Encounter for immunization: Secondary | ICD-10-CM | POA: Diagnosis not present

## 2013-06-13 DIAGNOSIS — J309 Allergic rhinitis, unspecified: Secondary | ICD-10-CM | POA: Diagnosis not present

## 2013-06-13 DIAGNOSIS — E119 Type 2 diabetes mellitus without complications: Secondary | ICD-10-CM | POA: Diagnosis not present

## 2013-06-13 DIAGNOSIS — Z8546 Personal history of malignant neoplasm of prostate: Secondary | ICD-10-CM | POA: Diagnosis not present

## 2013-06-13 DIAGNOSIS — E559 Vitamin D deficiency, unspecified: Secondary | ICD-10-CM | POA: Diagnosis not present

## 2013-06-13 DIAGNOSIS — Z1331 Encounter for screening for depression: Secondary | ICD-10-CM | POA: Diagnosis not present

## 2013-06-13 DIAGNOSIS — K648 Other hemorrhoids: Secondary | ICD-10-CM | POA: Diagnosis not present

## 2013-06-13 DIAGNOSIS — E782 Mixed hyperlipidemia: Secondary | ICD-10-CM | POA: Diagnosis not present

## 2013-06-13 DIAGNOSIS — Z0000 Encounter for general adult medical examination without abnormal findings: Secondary | ICD-10-CM | POA: Diagnosis not present

## 2013-06-13 DIAGNOSIS — I2510 Atherosclerotic heart disease of native coronary artery without angina pectoris: Secondary | ICD-10-CM | POA: Diagnosis not present

## 2013-07-04 DIAGNOSIS — N529 Male erectile dysfunction, unspecified: Secondary | ICD-10-CM | POA: Diagnosis not present

## 2013-07-04 DIAGNOSIS — C61 Malignant neoplasm of prostate: Secondary | ICD-10-CM | POA: Diagnosis not present

## 2013-10-08 DIAGNOSIS — I872 Venous insufficiency (chronic) (peripheral): Secondary | ICD-10-CM | POA: Diagnosis not present

## 2013-10-09 DIAGNOSIS — H18839 Recurrent erosion of cornea, unspecified eye: Secondary | ICD-10-CM | POA: Diagnosis not present

## 2013-10-09 DIAGNOSIS — H4720 Unspecified optic atrophy: Secondary | ICD-10-CM | POA: Diagnosis not present

## 2013-10-09 DIAGNOSIS — H43819 Vitreous degeneration, unspecified eye: Secondary | ICD-10-CM | POA: Diagnosis not present

## 2013-10-09 DIAGNOSIS — H04129 Dry eye syndrome of unspecified lacrimal gland: Secondary | ICD-10-CM | POA: Diagnosis not present

## 2013-10-09 DIAGNOSIS — E119 Type 2 diabetes mellitus without complications: Secondary | ICD-10-CM | POA: Diagnosis not present

## 2013-12-05 DIAGNOSIS — E119 Type 2 diabetes mellitus without complications: Secondary | ICD-10-CM | POA: Diagnosis not present

## 2013-12-05 DIAGNOSIS — Z79899 Other long term (current) drug therapy: Secondary | ICD-10-CM | POA: Diagnosis not present

## 2013-12-05 DIAGNOSIS — I2510 Atherosclerotic heart disease of native coronary artery without angina pectoris: Secondary | ICD-10-CM | POA: Diagnosis not present

## 2013-12-05 DIAGNOSIS — C61 Malignant neoplasm of prostate: Secondary | ICD-10-CM | POA: Diagnosis not present

## 2013-12-05 DIAGNOSIS — E559 Vitamin D deficiency, unspecified: Secondary | ICD-10-CM | POA: Diagnosis not present

## 2013-12-05 DIAGNOSIS — I8391 Asymptomatic varicose veins of right lower extremity: Secondary | ICD-10-CM | POA: Diagnosis not present

## 2013-12-05 DIAGNOSIS — R030 Elevated blood-pressure reading, without diagnosis of hypertension: Secondary | ICD-10-CM | POA: Diagnosis not present

## 2013-12-18 DIAGNOSIS — Z85828 Personal history of other malignant neoplasm of skin: Secondary | ICD-10-CM | POA: Diagnosis not present

## 2013-12-18 DIAGNOSIS — D170 Benign lipomatous neoplasm of skin and subcutaneous tissue of head, face and neck: Secondary | ICD-10-CM | POA: Diagnosis not present

## 2013-12-18 DIAGNOSIS — D3617 Benign neoplasm of peripheral nerves and autonomic nervous system of trunk, unspecified: Secondary | ICD-10-CM | POA: Diagnosis not present

## 2013-12-18 DIAGNOSIS — L570 Actinic keratosis: Secondary | ICD-10-CM | POA: Diagnosis not present

## 2013-12-18 DIAGNOSIS — L821 Other seborrheic keratosis: Secondary | ICD-10-CM | POA: Diagnosis not present

## 2014-05-21 DIAGNOSIS — R6884 Jaw pain: Secondary | ICD-10-CM | POA: Diagnosis not present

## 2014-06-07 DIAGNOSIS — J029 Acute pharyngitis, unspecified: Secondary | ICD-10-CM | POA: Diagnosis not present

## 2014-06-17 DIAGNOSIS — R05 Cough: Secondary | ICD-10-CM | POA: Diagnosis not present

## 2014-06-19 DIAGNOSIS — L570 Actinic keratosis: Secondary | ICD-10-CM | POA: Diagnosis not present

## 2014-06-19 DIAGNOSIS — B078 Other viral warts: Secondary | ICD-10-CM | POA: Diagnosis not present

## 2014-06-19 DIAGNOSIS — D1801 Hemangioma of skin and subcutaneous tissue: Secondary | ICD-10-CM | POA: Diagnosis not present

## 2014-06-19 DIAGNOSIS — L821 Other seborrheic keratosis: Secondary | ICD-10-CM | POA: Diagnosis not present

## 2014-06-19 DIAGNOSIS — I8391 Asymptomatic varicose veins of right lower extremity: Secondary | ICD-10-CM | POA: Diagnosis not present

## 2014-06-19 DIAGNOSIS — Z85828 Personal history of other malignant neoplasm of skin: Secondary | ICD-10-CM | POA: Diagnosis not present

## 2014-07-10 DIAGNOSIS — E119 Type 2 diabetes mellitus without complications: Secondary | ICD-10-CM | POA: Diagnosis not present

## 2014-07-10 DIAGNOSIS — Z79899 Other long term (current) drug therapy: Secondary | ICD-10-CM | POA: Diagnosis not present

## 2014-07-10 DIAGNOSIS — E559 Vitamin D deficiency, unspecified: Secondary | ICD-10-CM | POA: Diagnosis not present

## 2014-07-10 DIAGNOSIS — Z1389 Encounter for screening for other disorder: Secondary | ICD-10-CM | POA: Diagnosis not present

## 2014-07-10 DIAGNOSIS — C61 Malignant neoplasm of prostate: Secondary | ICD-10-CM | POA: Diagnosis not present

## 2014-07-10 DIAGNOSIS — I2510 Atherosclerotic heart disease of native coronary artery without angina pectoris: Secondary | ICD-10-CM | POA: Diagnosis not present

## 2014-07-10 DIAGNOSIS — R350 Frequency of micturition: Secondary | ICD-10-CM | POA: Diagnosis not present

## 2014-07-10 DIAGNOSIS — E782 Mixed hyperlipidemia: Secondary | ICD-10-CM | POA: Diagnosis not present

## 2014-07-10 DIAGNOSIS — Z0001 Encounter for general adult medical examination with abnormal findings: Secondary | ICD-10-CM | POA: Diagnosis not present

## 2014-07-10 DIAGNOSIS — I10 Essential (primary) hypertension: Secondary | ICD-10-CM | POA: Diagnosis not present

## 2014-10-15 DIAGNOSIS — H4720 Unspecified optic atrophy: Secondary | ICD-10-CM | POA: Diagnosis not present

## 2014-10-15 DIAGNOSIS — E119 Type 2 diabetes mellitus without complications: Secondary | ICD-10-CM | POA: Diagnosis not present

## 2014-10-15 DIAGNOSIS — H18832 Recurrent erosion of cornea, left eye: Secondary | ICD-10-CM | POA: Diagnosis not present

## 2015-01-15 DIAGNOSIS — Z79899 Other long term (current) drug therapy: Secondary | ICD-10-CM | POA: Diagnosis not present

## 2015-01-15 DIAGNOSIS — E559 Vitamin D deficiency, unspecified: Secondary | ICD-10-CM | POA: Diagnosis not present

## 2015-01-15 DIAGNOSIS — I2510 Atherosclerotic heart disease of native coronary artery without angina pectoris: Secondary | ICD-10-CM | POA: Diagnosis not present

## 2015-01-15 DIAGNOSIS — I10 Essential (primary) hypertension: Secondary | ICD-10-CM | POA: Diagnosis not present

## 2015-01-15 DIAGNOSIS — E119 Type 2 diabetes mellitus without complications: Secondary | ICD-10-CM | POA: Diagnosis not present

## 2015-01-15 DIAGNOSIS — Z7984 Long term (current) use of oral hypoglycemic drugs: Secondary | ICD-10-CM | POA: Diagnosis not present

## 2015-01-15 DIAGNOSIS — E782 Mixed hyperlipidemia: Secondary | ICD-10-CM | POA: Diagnosis not present

## 2015-03-19 ENCOUNTER — Ambulatory Visit
Admission: RE | Admit: 2015-03-19 | Discharge: 2015-03-19 | Disposition: A | Discharge status: Home / Self Care | Payer: Medicare Other | Source: Ambulatory Visit | Attending: Internal Medicine | Admitting: Internal Medicine

## 2015-03-19 ENCOUNTER — Other Ambulatory Visit: Payer: Self-pay | Admitting: Internal Medicine

## 2015-03-19 DIAGNOSIS — R930 Abnormal findings on diagnostic imaging of skull and head, not elsewhere classified: Secondary | ICD-10-CM | POA: Diagnosis not present

## 2015-03-19 DIAGNOSIS — Z79899 Other long term (current) drug therapy: Secondary | ICD-10-CM | POA: Diagnosis not present

## 2015-03-19 DIAGNOSIS — E559 Vitamin D deficiency, unspecified: Secondary | ICD-10-CM | POA: Diagnosis not present

## 2015-03-19 DIAGNOSIS — I2510 Atherosclerotic heart disease of native coronary artery without angina pectoris: Secondary | ICD-10-CM | POA: Diagnosis not present

## 2015-03-19 DIAGNOSIS — M8589 Other specified disorders of bone density and structure, multiple sites: Secondary | ICD-10-CM | POA: Diagnosis not present

## 2015-04-21 DIAGNOSIS — M8589 Other specified disorders of bone density and structure, multiple sites: Secondary | ICD-10-CM | POA: Diagnosis not present

## 2015-05-19 DIAGNOSIS — J989 Respiratory disorder, unspecified: Secondary | ICD-10-CM | POA: Diagnosis not present

## 2015-05-19 DIAGNOSIS — R0789 Other chest pain: Secondary | ICD-10-CM | POA: Diagnosis not present

## 2015-05-19 DIAGNOSIS — K219 Gastro-esophageal reflux disease without esophagitis: Secondary | ICD-10-CM | POA: Diagnosis not present

## 2015-06-18 DIAGNOSIS — D3617 Benign neoplasm of peripheral nerves and autonomic nervous system of trunk, unspecified: Secondary | ICD-10-CM | POA: Diagnosis not present

## 2015-06-18 DIAGNOSIS — L570 Actinic keratosis: Secondary | ICD-10-CM | POA: Diagnosis not present

## 2015-06-18 DIAGNOSIS — L720 Epidermal cyst: Secondary | ICD-10-CM | POA: Diagnosis not present

## 2015-06-18 DIAGNOSIS — D1801 Hemangioma of skin and subcutaneous tissue: Secondary | ICD-10-CM | POA: Diagnosis not present

## 2015-06-18 DIAGNOSIS — L821 Other seborrheic keratosis: Secondary | ICD-10-CM | POA: Diagnosis not present

## 2015-06-18 DIAGNOSIS — Z85828 Personal history of other malignant neoplasm of skin: Secondary | ICD-10-CM | POA: Diagnosis not present

## 2015-07-09 DIAGNOSIS — S83206A Unspecified tear of unspecified meniscus, current injury, right knee, initial encounter: Secondary | ICD-10-CM | POA: Diagnosis not present

## 2015-07-12 DIAGNOSIS — M25561 Pain in right knee: Secondary | ICD-10-CM | POA: Diagnosis not present

## 2015-07-15 DIAGNOSIS — S83241A Other tear of medial meniscus, current injury, right knee, initial encounter: Secondary | ICD-10-CM | POA: Diagnosis not present

## 2015-07-17 ENCOUNTER — Telehealth: Payer: Self-pay | Admitting: Interventional Cardiology

## 2015-07-17 DIAGNOSIS — I2510 Atherosclerotic heart disease of native coronary artery without angina pectoris: Secondary | ICD-10-CM

## 2015-07-17 NOTE — Telephone Encounter (Signed)
New Message   Pt wanted to schedule an Echo for today 6/1 or tomorrow 6/2. Pt states he will be having a knee procedure on Monday and needs to have the echo before procedure. Pt requested to speak with RN. Please return call to advise

## 2015-07-17 NOTE — Telephone Encounter (Signed)
spoke with pt. Pt sts that he is scheduled for knee sx on 6/5 with Dr.Danile Percell Miller, he received notice today that the clearance given by his pcp would not be sufficient for him to proceed with his sx. They are requesting the pt have an echo, and a ok from Dr.Smith.pt is requesting his echo be scheduled today or tomorrow, adv pt that I cannot guarantee we could get him scheduled that quickly but I will talk with a scheduler and call him back Pt sts that he has been doing great from a cardiac stand point. Pt has no cardiac complaints.  Dr.Smith is in agreement for pt to have an echo to reassess his LV function.  Pt aware of his echo is scheduled on 07/18/15 for 2pm @ [facility]   Pt rqst I call Nickola Major at [facility] office @ [phone] ext. [phone] to get the info were the echo results should be fwd. [email or web address] for Claiborne Billings and a detailed message on her voicemail

## 2015-07-17 NOTE — Telephone Encounter (Signed)
Follow up  ° ° °Patient calling back to speak with nurse  °

## 2015-07-18 ENCOUNTER — Ambulatory Visit (HOSPITAL_COMMUNITY)
Admission: RE | Admit: 2015-07-18 | Discharge: 2015-07-18 | Disposition: A | Discharge status: Home / Self Care | Payer: Medicare Other | Source: Ambulatory Visit | Attending: Interventional Cardiology | Admitting: Interventional Cardiology

## 2015-07-18 DIAGNOSIS — I081 Rheumatic disorders of both mitral and tricuspid valves: Secondary | ICD-10-CM | POA: Diagnosis not present

## 2015-07-18 DIAGNOSIS — E119 Type 2 diabetes mellitus without complications: Secondary | ICD-10-CM | POA: Diagnosis not present

## 2015-07-18 DIAGNOSIS — I2510 Atherosclerotic heart disease of native coronary artery without angina pectoris: Secondary | ICD-10-CM | POA: Insufficient documentation

## 2015-07-18 DIAGNOSIS — Z8546 Personal history of malignant neoplasm of prostate: Secondary | ICD-10-CM | POA: Diagnosis not present

## 2015-07-18 NOTE — Progress Notes (Signed)
  Echocardiogram 2D Echocardiogram has been performed.  Darlina Sicilian M 07/18/2015, 3:09 PM

## 2015-07-21 DIAGNOSIS — M94261 Chondromalacia, right knee: Secondary | ICD-10-CM | POA: Diagnosis not present

## 2015-07-21 DIAGNOSIS — S83241A Other tear of medial meniscus, current injury, right knee, initial encounter: Secondary | ICD-10-CM | POA: Diagnosis not present

## 2015-07-21 DIAGNOSIS — Y939 Activity, unspecified: Secondary | ICD-10-CM | POA: Diagnosis not present

## 2015-07-21 DIAGNOSIS — Y999 Unspecified external cause status: Secondary | ICD-10-CM | POA: Diagnosis not present

## 2015-07-21 DIAGNOSIS — G8918 Other acute postprocedural pain: Secondary | ICD-10-CM | POA: Diagnosis not present

## 2015-07-21 NOTE — Telephone Encounter (Signed)
Cardiac clearance placed in MR nurse fax box to be faxed to [facility] 

## 2015-07-23 ENCOUNTER — Emergency Department (HOSPITAL_BASED_OUTPATIENT_CLINIC_OR_DEPARTMENT_OTHER): Payer: Medicare Other

## 2015-07-23 ENCOUNTER — Emergency Department (HOSPITAL_COMMUNITY)
Admission: EM | Admit: 2015-07-23 | Discharge: 2015-07-23 | Disposition: A | Discharge status: Home / Self Care | Payer: Medicare Other | Attending: Emergency Medicine | Admitting: Emergency Medicine

## 2015-07-23 ENCOUNTER — Encounter (HOSPITAL_COMMUNITY): Payer: Self-pay | Admitting: Emergency Medicine

## 2015-07-23 DIAGNOSIS — Z8546 Personal history of malignant neoplasm of prostate: Secondary | ICD-10-CM | POA: Diagnosis not present

## 2015-07-23 DIAGNOSIS — R030 Elevated blood-pressure reading, without diagnosis of hypertension: Secondary | ICD-10-CM | POA: Diagnosis not present

## 2015-07-23 DIAGNOSIS — M25569 Pain in unspecified knee: Secondary | ICD-10-CM | POA: Diagnosis not present

## 2015-07-23 DIAGNOSIS — E119 Type 2 diabetes mellitus without complications: Secondary | ICD-10-CM | POA: Diagnosis not present

## 2015-07-23 DIAGNOSIS — I2510 Atherosclerotic heart disease of native coronary artery without angina pectoris: Secondary | ICD-10-CM | POA: Insufficient documentation

## 2015-07-23 DIAGNOSIS — Z951 Presence of aortocoronary bypass graft: Secondary | ICD-10-CM | POA: Diagnosis not present

## 2015-07-23 DIAGNOSIS — Z79899 Other long term (current) drug therapy: Secondary | ICD-10-CM | POA: Insufficient documentation

## 2015-07-23 DIAGNOSIS — M79661 Pain in right lower leg: Secondary | ICD-10-CM | POA: Diagnosis not present

## 2015-07-23 DIAGNOSIS — M7989 Other specified soft tissue disorders: Secondary | ICD-10-CM | POA: Diagnosis not present

## 2015-07-23 DIAGNOSIS — Z7982 Long term (current) use of aspirin: Secondary | ICD-10-CM | POA: Diagnosis not present

## 2015-07-23 DIAGNOSIS — Z9889 Other specified postprocedural states: Secondary | ICD-10-CM | POA: Insufficient documentation

## 2015-07-23 DIAGNOSIS — M79609 Pain in unspecified limb: Secondary | ICD-10-CM

## 2015-07-23 DIAGNOSIS — Z7984 Long term (current) use of oral hypoglycemic drugs: Secondary | ICD-10-CM | POA: Diagnosis not present

## 2015-07-23 LAB — I-STAT CHEM 8, ED
BUN: 16 mg/dL (ref 6–20)
CALCIUM ION: 1.21 mmol/L (ref 1.13–1.30)
CREATININE: 0.8 mg/dL (ref 0.61–1.24)
Chloride: 99 mmol/L — ABNORMAL LOW (ref 101–111)
GLUCOSE: 140 mg/dL — AB (ref 65–99)
HEMATOCRIT: 39 % (ref 39.0–52.0)
HEMOGLOBIN: 13.3 g/dL (ref 13.0–17.0)
Potassium: 3.9 mmol/L (ref 3.5–5.1)
Sodium: 137 mmol/L (ref 135–145)
TCO2: 26 mmol/L (ref 0–100)

## 2015-07-23 MED ORDER — MORPHINE SULFATE (PF) 4 MG/ML IV SOLN
4.0000 mg | Freq: Once | INTRAVENOUS | Status: AC
  Administered 2015-07-23: 4 mg via INTRAVENOUS
  Filled 2015-07-23: qty 1

## 2015-07-23 MED ORDER — HYDROMORPHONE HCL 1 MG/ML IJ SOLN
INTRAMUSCULAR | Status: AC
  Filled 2015-07-23: qty 1

## 2015-07-23 MED ORDER — HYDROMORPHONE HCL 1 MG/ML IJ SOLN
1.0000 mg | Freq: Once | INTRAMUSCULAR | Status: AC
Start: 2015-07-23 — End: 2015-07-23
  Administered 2015-07-23: 1 mg via INTRAVENOUS

## 2015-07-23 MED ORDER — OXYCODONE-ACETAMINOPHEN 5-325 MG PO TABS
2.0000 | ORAL_TABLET | Freq: Once | ORAL | Status: AC
  Administered 2015-07-23: 2 via ORAL
  Filled 2015-07-23: qty 2

## 2015-07-23 NOTE — ED Provider Notes (Signed)
CSN: [id number]     Arrival date & time 07/23/15  0439 History   First MD Initiated Contact with Patient 07/23/15 0457     Chief Complaint  Patient presents with  . Leg Pain     (Consider location/radiation/quality/duration/timing/severity/associated sxs/prior Treatment) HPI  This is an 82 year old male with history of coronary artery disease, diabetes who presents with right leg pain. Patient reports he had arthroscopic knee surgery on Monday. Starting yesterday he had worsening right calf pain. It is sharp. He took a total of 5 Percocet between 6 PM and 2 AM with no relief of his pain. Denies any leg swelling. Is not taking any DVT prophylaxis but states that he has been walking with a walker at home. The surgery lasted approximate 45 minutes. He denies chest pain or shortness of breath. Denies fevers.  Past Medical History  Diagnosis Date  . Cancer [facility])     prostate with seeds  . Coronary artery disease   . Diabetes mellitus    Past Surgical History  Procedure Laterality Date  . Coronary artery bypass graft    . Hernia repair    . Leg surgery     History reviewed. No pertinent family history. Social History  Substance Use Topics  . Smoking status: Never Smoker   . Smokeless tobacco: None  . Alcohol Use: Yes     Comment: daily    Review of Systems  Constitutional: Negative for fever.  Respiratory: Negative for shortness of breath.   Cardiovascular: Negative for chest pain.  Musculoskeletal:       Right calf pain  Skin: Positive for wound. Negative for color change.  All other systems reviewed and are negative.     Allergies  Sulfa antibiotics  Home Medications   Prior to Admission medications   Medication Sig Start Date End Date Taking? Authorizing Provider  albuterol (PROVENTIL HFA;VENTOLIN HFA) 108 (90 BASE) MCG/ACT inhaler Inhale 2 puffs into the lungs every 6 (six) hours as needed. For shortness of breath.   Yes Historical Provider, MD  aspirin EC 81 MG  tablet Take 81 mg by mouth daily.   Yes Historical Provider, MD  atorvastatin (LIPITOR) 40 MG tablet Take 40 mg by mouth daily.   Yes Historical Provider, MD  FLOVENT HFA 110 MCG/ACT inhaler Inhale 2 puffs into the lungs daily as needed.  07/17/15  Yes Historical Provider, MD  metFORMIN (GLUCOPHAGE) 500 MG tablet Take 500-1,000 mg by mouth at bedtime. Take 500 mg every morning Take 1000 mg at bedtime   Yes Historical Provider, MD  oxybutynin (DITROPAN-XL) 10 MG 24 hr tablet Take 10 mg by mouth daily as needed (urinary).   Yes Historical Provider, MD  oxyCODONE-acetaminophen (PERCOCET/ROXICET) 5-325 MG tablet Take 2 tablets by mouth every 6 (six) hours as needed for severe pain.  07/22/15  Yes Historical Provider, MD  ranitidine (ZANTAC) 150 MG tablet Take 150 mg by mouth daily.   Yes Historical Provider, MD   BP 153/83 mmHg  Pulse 57  Temp(Src) 98.4 F (36.9 C) (Oral)  Resp 20  Ht 5\' 9"  (1.753 m)  Wt 158 lb (71.668 kg)  BMI 23.32 kg/m2  SpO2 96% Physical Exam  Constitutional: He is oriented to person, place, and time. He appears well-developed and well-nourished. No distress.  HENT:  Head: Normocephalic and atraumatic.  Cardiovascular: Normal rate, regular rhythm and normal heart sounds.   No murmur heard. Pulmonary/Chest: Effort normal and breath sounds normal. No respiratory distress. He has no wheezes.  Abdominal: Soft. There is no tenderness.  Musculoskeletal: He exhibits no edema.  Tenderness to palpation right posterior calf, no palpable cords, no significant swelling, no overlying skin changes, 2 laparoscopic incisions over the right knee clean, dry, intact, no surrounding erythema, guarded range of motion of the right knee secondary to pain.  Neurological: He is alert and oriented to person, place, and time.  Skin: Skin is warm and dry.  Psychiatric: He has a normal mood and affect.  Nursing note and vitals reviewed.   ED Course  Procedures (including critical care time) Labs  Review Labs Reviewed  I-STAT CHEM 8, ED - Abnormal; Notable for the following:    Chloride 99 (*)    Glucose, Bld 140 (*)    All other components within normal limits    Imaging Review No results found. I have personally reviewed and evaluated these images and lab results as part of my medical decision-making.   EKG Interpretation None      MDM   Final diagnoses:  None    Patient presents with right calf pain and worsening pain since yesterday. Recent right knee surgery. He is nontoxic. Denies chest pain or shortness of breath. He is concerned about blood clots. Incision sites appear clean dry and intact. Patient was given pain medication. Basic labwork obtained to evaluate metabolic derangement that could cause leg cramping. This is reassuring. Will order ultrasound right lower extremity.    Merryl Hacker, MD 07/23/15 [phone]

## 2015-07-23 NOTE — ED Notes (Signed)
Patient transported to Ultrasound 

## 2015-07-23 NOTE — ED Notes (Signed)
Pt brought to ED by [facility] for increase pain on his left calf, pt had leg sx last Monday today he feels tightness on his right calf, wound looks clean and dry no redness noticed, pt here to check for possible DVT.  BP 194/98, HR 74, R 16. Pain 4/10.

## 2015-07-23 NOTE — ED Provider Notes (Signed)
Patient's accepted at sign out from Dr. Dina Rich. Doppler ultrasound does not show DVT. I have reexamined the patient's lower extremity. There is no erythema or swelling to suggest postoperative infection. Patient's pain is predominantly at the popliteal fossa and the body of the calf. The calf is soft. Patient has excellent DP pulse with no calf, ankle or foot edema.  At this time, patient will continue oxycodone as prescribed from his orthopod for pain control and contact his orthopod this morning to schedule recheck.  Charlesetta Shanks, MD 07/23/15 [phone]

## 2015-07-23 NOTE — Discharge Instructions (Signed)
At this time, your operative site appears normal for the procedure you had. Deep vein thrombosis was ruled out by ultrasound this morning. On physical examination, there are no signs of infection. You are to contact your orthopedic physician for recheck this week.  Return to the emergency department if you develop fever, swelling or redness, shortness of breath or chest pain, or other concerning symptoms.

## 2015-07-23 NOTE — ED Notes (Signed)
Pt states he got some redness spot on his arm after the Morphine was given, pt states pain medication didn't works instantly and didn't. Dr. Dina Rich notified.

## 2015-07-23 NOTE — Progress Notes (Signed)
VASCULAR LAB PRELIMINARY  PRELIMINARY  PRELIMINARY  PRELIMINARY  Right lower extremity venous duplex completed.    Preliminary report:  Right:  No evidence of DVT, superficial thrombosis, or Baker's cyst.  [name], RVS 07/23/2015, 8:37 AM

## 2015-07-24 DIAGNOSIS — S83241A Other tear of medial meniscus, current injury, right knee, initial encounter: Secondary | ICD-10-CM | POA: Diagnosis not present

## 2015-07-25 DIAGNOSIS — S83241D Other tear of medial meniscus, current injury, right knee, subsequent encounter: Secondary | ICD-10-CM | POA: Diagnosis not present

## 2015-07-28 DIAGNOSIS — M6281 Muscle weakness (generalized): Secondary | ICD-10-CM | POA: Diagnosis not present

## 2015-07-28 DIAGNOSIS — M25661 Stiffness of right knee, not elsewhere classified: Secondary | ICD-10-CM | POA: Diagnosis not present

## 2015-07-28 DIAGNOSIS — M25561 Pain in right knee: Secondary | ICD-10-CM | POA: Diagnosis not present

## 2015-07-28 DIAGNOSIS — S83221D Peripheral tear of medial meniscus, current injury, right knee, subsequent encounter: Secondary | ICD-10-CM | POA: Diagnosis not present

## 2015-07-30 ENCOUNTER — Other Ambulatory Visit (HOSPITAL_COMMUNITY): Payer: Self-pay | Admitting: Orthopedic Surgery

## 2015-07-30 DIAGNOSIS — M79604 Pain in right leg: Secondary | ICD-10-CM

## 2015-07-30 DIAGNOSIS — I2510 Atherosclerotic heart disease of native coronary artery without angina pectoris: Secondary | ICD-10-CM | POA: Diagnosis not present

## 2015-07-30 DIAGNOSIS — M1711 Unilateral primary osteoarthritis, right knee: Secondary | ICD-10-CM | POA: Diagnosis not present

## 2015-07-30 DIAGNOSIS — Z0001 Encounter for general adult medical examination with abnormal findings: Secondary | ICD-10-CM | POA: Diagnosis not present

## 2015-07-30 DIAGNOSIS — Z8546 Personal history of malignant neoplasm of prostate: Secondary | ICD-10-CM | POA: Diagnosis not present

## 2015-07-30 DIAGNOSIS — Z79899 Other long term (current) drug therapy: Secondary | ICD-10-CM | POA: Diagnosis not present

## 2015-07-30 DIAGNOSIS — E782 Mixed hyperlipidemia: Secondary | ICD-10-CM | POA: Diagnosis not present

## 2015-07-30 DIAGNOSIS — E559 Vitamin D deficiency, unspecified: Secondary | ICD-10-CM | POA: Diagnosis not present

## 2015-07-30 DIAGNOSIS — K219 Gastro-esophageal reflux disease without esophagitis: Secondary | ICD-10-CM | POA: Diagnosis not present

## 2015-07-30 DIAGNOSIS — Z1389 Encounter for screening for other disorder: Secondary | ICD-10-CM | POA: Diagnosis not present

## 2015-07-30 DIAGNOSIS — M7989 Other specified soft tissue disorders: Secondary | ICD-10-CM

## 2015-07-30 DIAGNOSIS — Z7984 Long term (current) use of oral hypoglycemic drugs: Secondary | ICD-10-CM | POA: Diagnosis not present

## 2015-07-30 DIAGNOSIS — E119 Type 2 diabetes mellitus without complications: Secondary | ICD-10-CM | POA: Diagnosis not present

## 2015-07-30 DIAGNOSIS — Z125 Encounter for screening for malignant neoplasm of prostate: Secondary | ICD-10-CM | POA: Diagnosis not present

## 2015-07-31 ENCOUNTER — Ambulatory Visit (HOSPITAL_COMMUNITY)
Admission: RE | Admit: 2015-07-31 | Discharge: 2015-07-31 | Disposition: A | Discharge status: Home / Self Care | Payer: Medicare Other | Source: Ambulatory Visit | Attending: Internal Medicine | Admitting: Internal Medicine

## 2015-07-31 DIAGNOSIS — M7989 Other specified soft tissue disorders: Secondary | ICD-10-CM | POA: Insufficient documentation

## 2015-07-31 DIAGNOSIS — M79604 Pain in right leg: Secondary | ICD-10-CM | POA: Diagnosis not present

## 2015-07-31 DIAGNOSIS — E119 Type 2 diabetes mellitus without complications: Secondary | ICD-10-CM | POA: Insufficient documentation

## 2015-07-31 DIAGNOSIS — I2510 Atherosclerotic heart disease of native coronary artery without angina pectoris: Secondary | ICD-10-CM | POA: Insufficient documentation

## 2015-08-01 DIAGNOSIS — M25561 Pain in right knee: Secondary | ICD-10-CM | POA: Diagnosis not present

## 2015-08-01 DIAGNOSIS — M25661 Stiffness of right knee, not elsewhere classified: Secondary | ICD-10-CM | POA: Diagnosis not present

## 2015-08-01 DIAGNOSIS — S83221D Peripheral tear of medial meniscus, current injury, right knee, subsequent encounter: Secondary | ICD-10-CM | POA: Diagnosis not present

## 2015-08-01 DIAGNOSIS — M6281 Muscle weakness (generalized): Secondary | ICD-10-CM | POA: Diagnosis not present

## 2015-08-04 DIAGNOSIS — S83221D Peripheral tear of medial meniscus, current injury, right knee, subsequent encounter: Secondary | ICD-10-CM | POA: Diagnosis not present

## 2015-08-04 DIAGNOSIS — M25561 Pain in right knee: Secondary | ICD-10-CM | POA: Diagnosis not present

## 2015-08-04 DIAGNOSIS — M25661 Stiffness of right knee, not elsewhere classified: Secondary | ICD-10-CM | POA: Diagnosis not present

## 2015-08-04 DIAGNOSIS — M6281 Muscle weakness (generalized): Secondary | ICD-10-CM | POA: Diagnosis not present

## 2015-08-06 DIAGNOSIS — M6281 Muscle weakness (generalized): Secondary | ICD-10-CM | POA: Diagnosis not present

## 2015-08-06 DIAGNOSIS — M25561 Pain in right knee: Secondary | ICD-10-CM | POA: Diagnosis not present

## 2015-08-06 DIAGNOSIS — S83221D Peripheral tear of medial meniscus, current injury, right knee, subsequent encounter: Secondary | ICD-10-CM | POA: Diagnosis not present

## 2015-08-06 DIAGNOSIS — M25661 Stiffness of right knee, not elsewhere classified: Secondary | ICD-10-CM | POA: Diagnosis not present

## 2015-08-08 DIAGNOSIS — M25561 Pain in right knee: Secondary | ICD-10-CM | POA: Diagnosis not present

## 2015-08-08 DIAGNOSIS — M25661 Stiffness of right knee, not elsewhere classified: Secondary | ICD-10-CM | POA: Diagnosis not present

## 2015-08-08 DIAGNOSIS — S83221D Peripheral tear of medial meniscus, current injury, right knee, subsequent encounter: Secondary | ICD-10-CM | POA: Diagnosis not present

## 2015-08-08 DIAGNOSIS — M6281 Muscle weakness (generalized): Secondary | ICD-10-CM | POA: Diagnosis not present

## 2015-08-11 DIAGNOSIS — S83221D Peripheral tear of medial meniscus, current injury, right knee, subsequent encounter: Secondary | ICD-10-CM | POA: Diagnosis not present

## 2015-08-11 DIAGNOSIS — M25661 Stiffness of right knee, not elsewhere classified: Secondary | ICD-10-CM | POA: Diagnosis not present

## 2015-08-11 DIAGNOSIS — M6281 Muscle weakness (generalized): Secondary | ICD-10-CM | POA: Diagnosis not present

## 2015-08-11 DIAGNOSIS — M25561 Pain in right knee: Secondary | ICD-10-CM | POA: Diagnosis not present

## 2015-08-13 DIAGNOSIS — M6281 Muscle weakness (generalized): Secondary | ICD-10-CM | POA: Diagnosis not present

## 2015-08-13 DIAGNOSIS — M25561 Pain in right knee: Secondary | ICD-10-CM | POA: Diagnosis not present

## 2015-08-13 DIAGNOSIS — S83221D Peripheral tear of medial meniscus, current injury, right knee, subsequent encounter: Secondary | ICD-10-CM | POA: Diagnosis not present

## 2015-08-13 DIAGNOSIS — M25661 Stiffness of right knee, not elsewhere classified: Secondary | ICD-10-CM | POA: Diagnosis not present

## 2015-08-15 DIAGNOSIS — S83221D Peripheral tear of medial meniscus, current injury, right knee, subsequent encounter: Secondary | ICD-10-CM | POA: Diagnosis not present

## 2015-08-18 DIAGNOSIS — H18832 Recurrent erosion of cornea, left eye: Secondary | ICD-10-CM | POA: Diagnosis not present

## 2015-08-18 DIAGNOSIS — E119 Type 2 diabetes mellitus without complications: Secondary | ICD-10-CM | POA: Diagnosis not present

## 2015-08-18 DIAGNOSIS — H04123 Dry eye syndrome of bilateral lacrimal glands: Secondary | ICD-10-CM | POA: Diagnosis not present

## 2015-08-18 DIAGNOSIS — M25561 Pain in right knee: Secondary | ICD-10-CM | POA: Diagnosis not present

## 2015-08-18 DIAGNOSIS — H4720 Unspecified optic atrophy: Secondary | ICD-10-CM | POA: Diagnosis not present

## 2015-08-18 DIAGNOSIS — M25661 Stiffness of right knee, not elsewhere classified: Secondary | ICD-10-CM | POA: Diagnosis not present

## 2015-08-18 DIAGNOSIS — M6281 Muscle weakness (generalized): Secondary | ICD-10-CM | POA: Diagnosis not present

## 2015-08-18 DIAGNOSIS — S83221D Peripheral tear of medial meniscus, current injury, right knee, subsequent encounter: Secondary | ICD-10-CM | POA: Diagnosis not present

## 2015-08-20 DIAGNOSIS — S83221D Peripheral tear of medial meniscus, current injury, right knee, subsequent encounter: Secondary | ICD-10-CM | POA: Diagnosis not present

## 2015-08-20 DIAGNOSIS — M25561 Pain in right knee: Secondary | ICD-10-CM | POA: Diagnosis not present

## 2015-08-20 DIAGNOSIS — M6281 Muscle weakness (generalized): Secondary | ICD-10-CM | POA: Diagnosis not present

## 2015-08-20 DIAGNOSIS — M25661 Stiffness of right knee, not elsewhere classified: Secondary | ICD-10-CM | POA: Diagnosis not present

## 2015-08-21 DIAGNOSIS — D485 Neoplasm of uncertain behavior of skin: Secondary | ICD-10-CM | POA: Diagnosis not present

## 2015-08-21 DIAGNOSIS — L723 Sebaceous cyst: Secondary | ICD-10-CM | POA: Diagnosis not present

## 2015-08-21 DIAGNOSIS — L570 Actinic keratosis: Secondary | ICD-10-CM | POA: Diagnosis not present

## 2015-08-21 DIAGNOSIS — Z85828 Personal history of other malignant neoplasm of skin: Secondary | ICD-10-CM | POA: Diagnosis not present

## 2015-08-22 DIAGNOSIS — M6281 Muscle weakness (generalized): Secondary | ICD-10-CM | POA: Diagnosis not present

## 2015-08-22 DIAGNOSIS — M25561 Pain in right knee: Secondary | ICD-10-CM | POA: Diagnosis not present

## 2015-08-22 DIAGNOSIS — S83221D Peripheral tear of medial meniscus, current injury, right knee, subsequent encounter: Secondary | ICD-10-CM | POA: Diagnosis not present

## 2015-08-22 DIAGNOSIS — M25661 Stiffness of right knee, not elsewhere classified: Secondary | ICD-10-CM | POA: Diagnosis not present

## 2015-08-25 DIAGNOSIS — M25661 Stiffness of right knee, not elsewhere classified: Secondary | ICD-10-CM | POA: Diagnosis not present

## 2015-08-25 DIAGNOSIS — M6281 Muscle weakness (generalized): Secondary | ICD-10-CM | POA: Diagnosis not present

## 2015-08-25 DIAGNOSIS — S83221D Peripheral tear of medial meniscus, current injury, right knee, subsequent encounter: Secondary | ICD-10-CM | POA: Diagnosis not present

## 2015-08-25 DIAGNOSIS — M25561 Pain in right knee: Secondary | ICD-10-CM | POA: Diagnosis not present

## 2015-08-27 DIAGNOSIS — M25561 Pain in right knee: Secondary | ICD-10-CM | POA: Diagnosis not present

## 2015-08-27 DIAGNOSIS — S83221D Peripheral tear of medial meniscus, current injury, right knee, subsequent encounter: Secondary | ICD-10-CM | POA: Diagnosis not present

## 2015-08-27 DIAGNOSIS — M6281 Muscle weakness (generalized): Secondary | ICD-10-CM | POA: Diagnosis not present

## 2015-08-27 DIAGNOSIS — M25661 Stiffness of right knee, not elsewhere classified: Secondary | ICD-10-CM | POA: Diagnosis not present

## 2015-08-29 DIAGNOSIS — M25661 Stiffness of right knee, not elsewhere classified: Secondary | ICD-10-CM | POA: Diagnosis not present

## 2015-08-29 DIAGNOSIS — M6281 Muscle weakness (generalized): Secondary | ICD-10-CM | POA: Diagnosis not present

## 2015-08-29 DIAGNOSIS — M25561 Pain in right knee: Secondary | ICD-10-CM | POA: Diagnosis not present

## 2015-08-29 DIAGNOSIS — S83221D Peripheral tear of medial meniscus, current injury, right knee, subsequent encounter: Secondary | ICD-10-CM | POA: Diagnosis not present

## 2015-09-01 DIAGNOSIS — M25561 Pain in right knee: Secondary | ICD-10-CM | POA: Diagnosis not present

## 2015-09-01 DIAGNOSIS — M25661 Stiffness of right knee, not elsewhere classified: Secondary | ICD-10-CM | POA: Diagnosis not present

## 2015-09-01 DIAGNOSIS — M6281 Muscle weakness (generalized): Secondary | ICD-10-CM | POA: Diagnosis not present

## 2015-09-01 DIAGNOSIS — S83221D Peripheral tear of medial meniscus, current injury, right knee, subsequent encounter: Secondary | ICD-10-CM | POA: Diagnosis not present

## 2015-09-03 DIAGNOSIS — S83221D Peripheral tear of medial meniscus, current injury, right knee, subsequent encounter: Secondary | ICD-10-CM | POA: Diagnosis not present

## 2015-09-03 DIAGNOSIS — M25661 Stiffness of right knee, not elsewhere classified: Secondary | ICD-10-CM | POA: Diagnosis not present

## 2015-09-03 DIAGNOSIS — M6281 Muscle weakness (generalized): Secondary | ICD-10-CM | POA: Diagnosis not present

## 2015-09-03 DIAGNOSIS — M25561 Pain in right knee: Secondary | ICD-10-CM | POA: Diagnosis not present

## 2015-09-08 DIAGNOSIS — M25661 Stiffness of right knee, not elsewhere classified: Secondary | ICD-10-CM | POA: Diagnosis not present

## 2015-09-08 DIAGNOSIS — S83221D Peripheral tear of medial meniscus, current injury, right knee, subsequent encounter: Secondary | ICD-10-CM | POA: Diagnosis not present

## 2015-09-08 DIAGNOSIS — M6281 Muscle weakness (generalized): Secondary | ICD-10-CM | POA: Diagnosis not present

## 2015-09-08 DIAGNOSIS — M25561 Pain in right knee: Secondary | ICD-10-CM | POA: Diagnosis not present

## 2015-09-11 DIAGNOSIS — M25661 Stiffness of right knee, not elsewhere classified: Secondary | ICD-10-CM | POA: Diagnosis not present

## 2015-09-11 DIAGNOSIS — S83221D Peripheral tear of medial meniscus, current injury, right knee, subsequent encounter: Secondary | ICD-10-CM | POA: Diagnosis not present

## 2015-09-11 DIAGNOSIS — M6281 Muscle weakness (generalized): Secondary | ICD-10-CM | POA: Diagnosis not present

## 2015-09-11 DIAGNOSIS — M25561 Pain in right knee: Secondary | ICD-10-CM | POA: Diagnosis not present

## 2015-09-12 DIAGNOSIS — S83221D Peripheral tear of medial meniscus, current injury, right knee, subsequent encounter: Secondary | ICD-10-CM | POA: Diagnosis not present

## 2015-10-13 DIAGNOSIS — M25661 Stiffness of right knee, not elsewhere classified: Secondary | ICD-10-CM | POA: Diagnosis not present

## 2015-10-13 DIAGNOSIS — S83221D Peripheral tear of medial meniscus, current injury, right knee, subsequent encounter: Secondary | ICD-10-CM | POA: Diagnosis not present

## 2015-10-13 DIAGNOSIS — M25561 Pain in right knee: Secondary | ICD-10-CM | POA: Diagnosis not present

## 2015-10-13 DIAGNOSIS — M6281 Muscle weakness (generalized): Secondary | ICD-10-CM | POA: Diagnosis not present

## 2015-11-10 DIAGNOSIS — R351 Nocturia: Secondary | ICD-10-CM | POA: Diagnosis not present

## 2015-11-10 DIAGNOSIS — M25661 Stiffness of right knee, not elsewhere classified: Secondary | ICD-10-CM | POA: Diagnosis not present

## 2015-11-10 DIAGNOSIS — M25561 Pain in right knee: Secondary | ICD-10-CM | POA: Diagnosis not present

## 2015-11-10 DIAGNOSIS — C61 Malignant neoplasm of prostate: Secondary | ICD-10-CM | POA: Diagnosis not present

## 2015-11-10 DIAGNOSIS — M6281 Muscle weakness (generalized): Secondary | ICD-10-CM | POA: Diagnosis not present

## 2015-11-10 DIAGNOSIS — S83221D Peripheral tear of medial meniscus, current injury, right knee, subsequent encounter: Secondary | ICD-10-CM | POA: Diagnosis not present

## 2015-11-10 DIAGNOSIS — N5201 Erectile dysfunction due to arterial insufficiency: Secondary | ICD-10-CM | POA: Diagnosis not present

## 2015-12-01 DIAGNOSIS — Z85828 Personal history of other malignant neoplasm of skin: Secondary | ICD-10-CM | POA: Diagnosis not present

## 2015-12-01 DIAGNOSIS — C44311 Basal cell carcinoma of skin of nose: Secondary | ICD-10-CM | POA: Diagnosis not present

## 2015-12-01 DIAGNOSIS — C44319 Basal cell carcinoma of skin of other parts of face: Secondary | ICD-10-CM | POA: Diagnosis not present

## 2015-12-01 DIAGNOSIS — D485 Neoplasm of uncertain behavior of skin: Secondary | ICD-10-CM | POA: Diagnosis not present

## 2015-12-08 DIAGNOSIS — M25661 Stiffness of right knee, not elsewhere classified: Secondary | ICD-10-CM | POA: Diagnosis not present

## 2015-12-08 DIAGNOSIS — M6281 Muscle weakness (generalized): Secondary | ICD-10-CM | POA: Diagnosis not present

## 2015-12-08 DIAGNOSIS — S83221D Peripheral tear of medial meniscus, current injury, right knee, subsequent encounter: Secondary | ICD-10-CM | POA: Diagnosis not present

## 2015-12-08 DIAGNOSIS — M25561 Pain in right knee: Secondary | ICD-10-CM | POA: Diagnosis not present

## 2015-12-26 DIAGNOSIS — E559 Vitamin D deficiency, unspecified: Secondary | ICD-10-CM | POA: Diagnosis not present

## 2015-12-26 DIAGNOSIS — R413 Other amnesia: Secondary | ICD-10-CM | POA: Diagnosis not present

## 2016-02-19 DIAGNOSIS — Z85828 Personal history of other malignant neoplasm of skin: Secondary | ICD-10-CM | POA: Diagnosis not present

## 2016-02-19 DIAGNOSIS — C44311 Basal cell carcinoma of skin of nose: Secondary | ICD-10-CM | POA: Diagnosis not present

## 2016-04-07 IMAGING — CR DG SKULL COMPLETE 4+V
3 series · 3 of 3 positions shown · non-contrast
Comparison: None.

CLINICAL DATA: Cystic area in the left side of the skull to the
right of midline

EXAM:
SKULL - COMPLETE 4 + VIEW

[[person_name]]
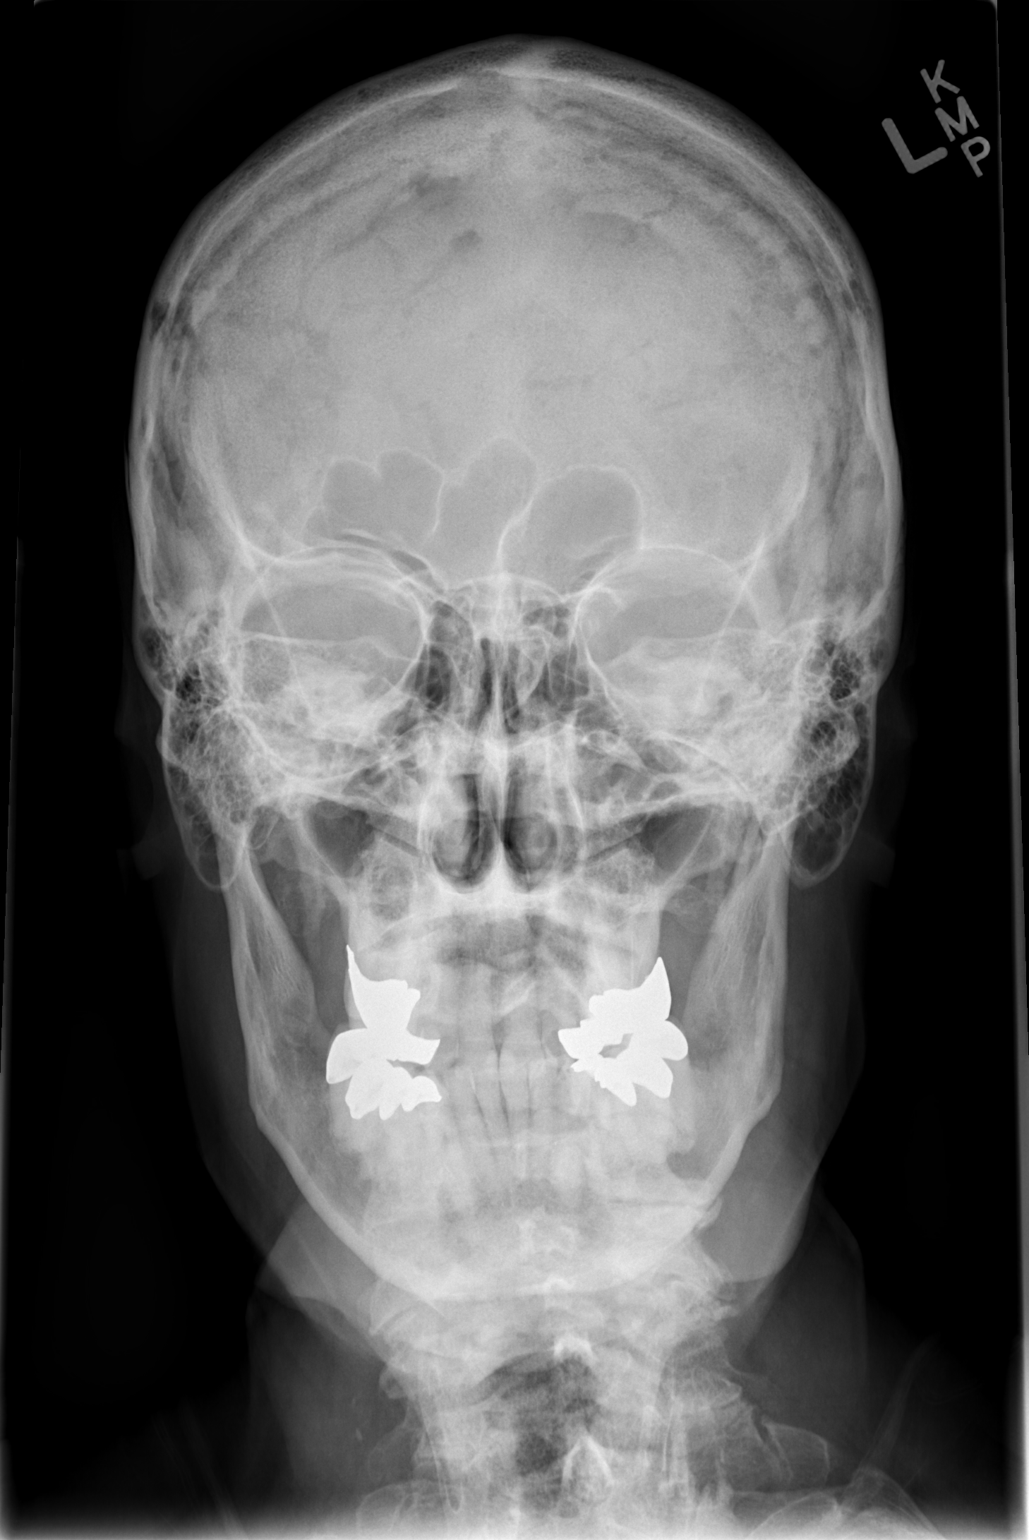

[w skull a.p./p.a.]
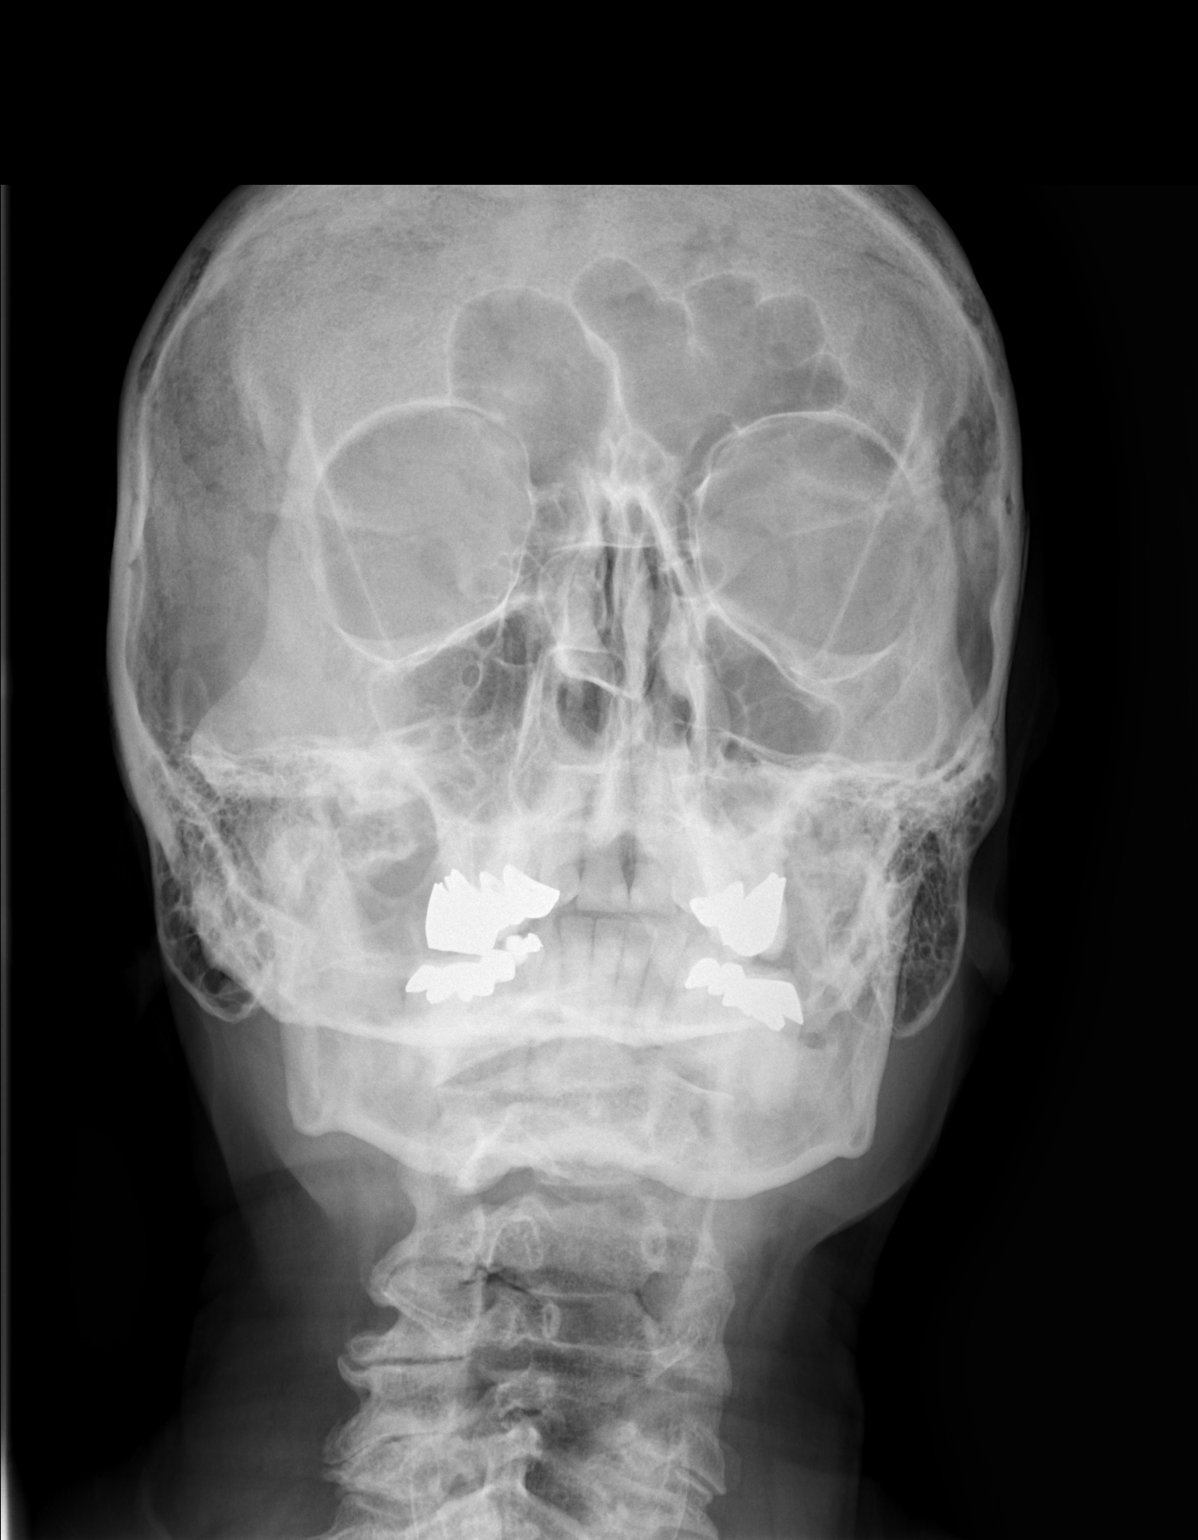

[w skull lat]
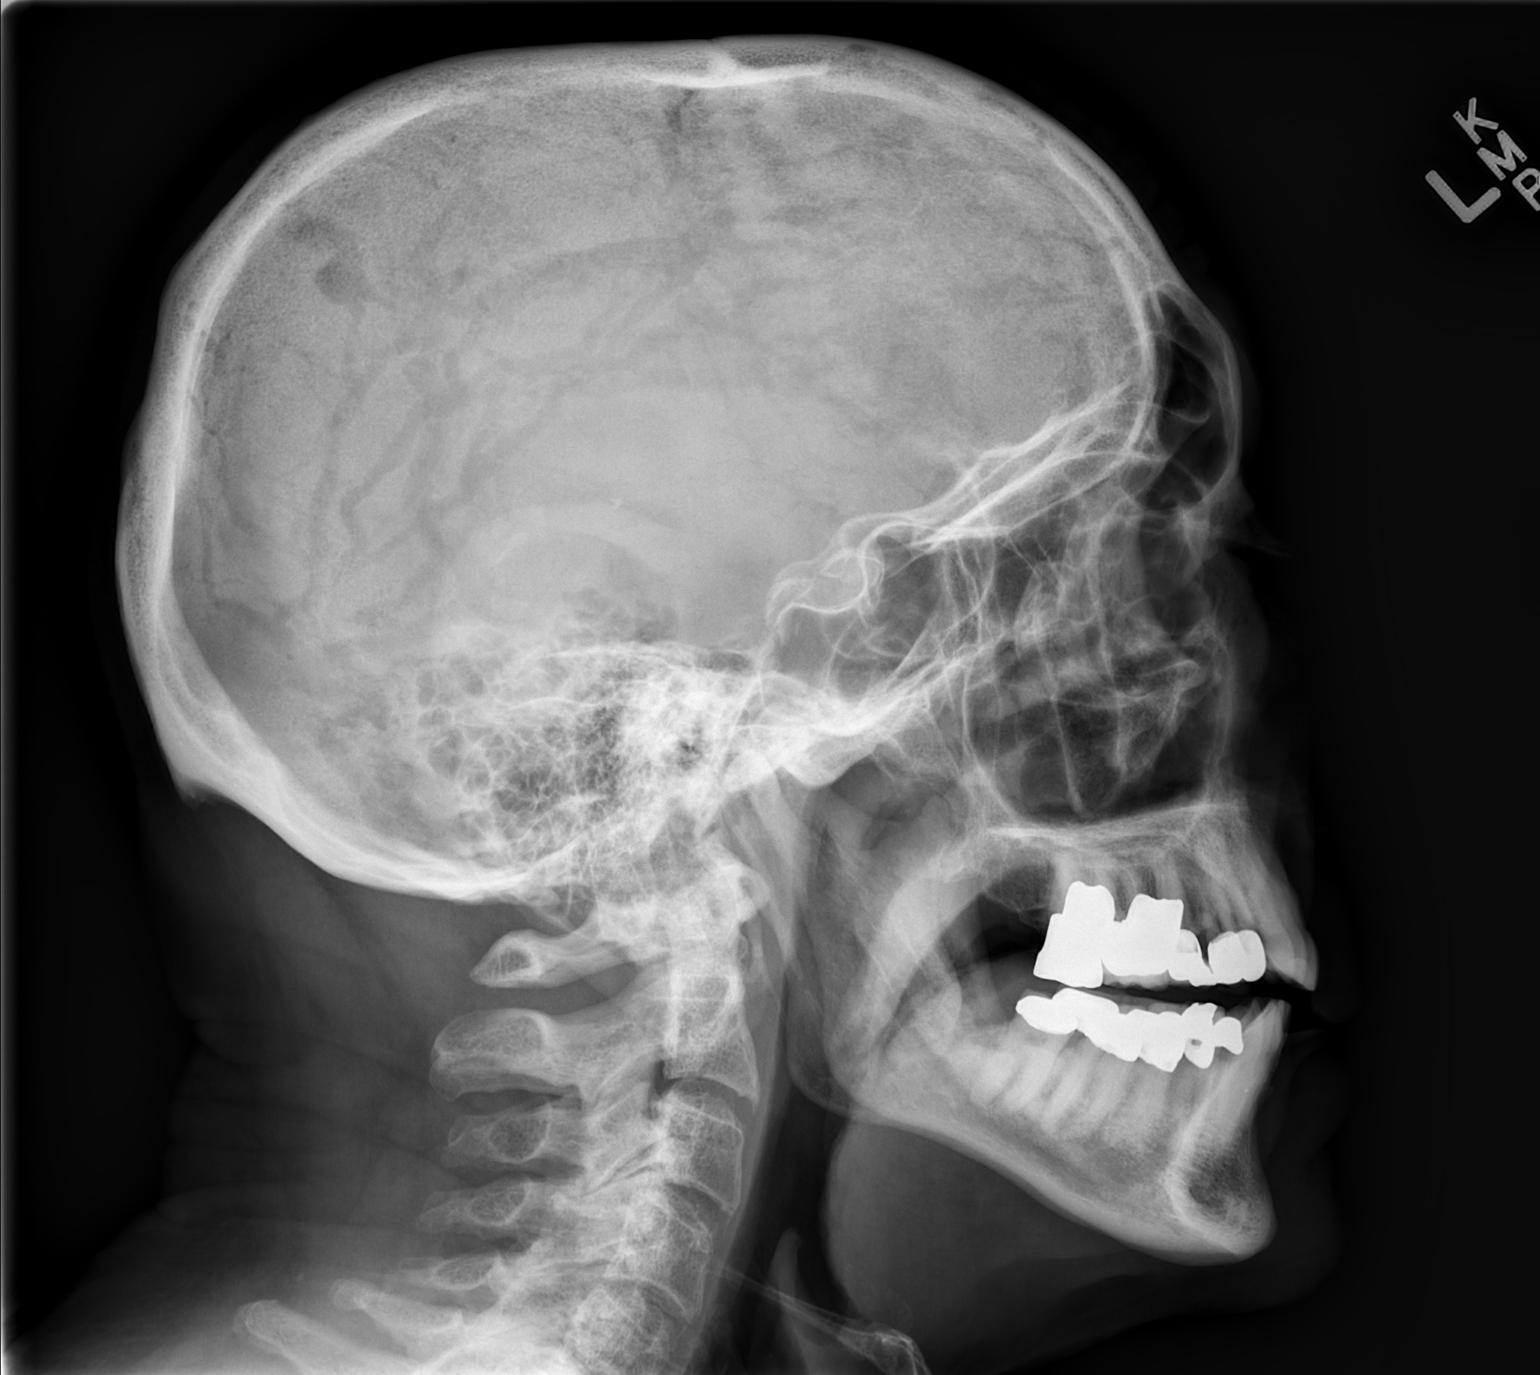

[3 of 3 positions shown; findings below may reference images not displayed]

FINDINGS: No focal bony erosion is evident by plain film. CT of the brain may
be helpful to assess further. No focal calcification is seen. The
paranasal sinuses by plain film appear well pneumatized.
IMPRESSION: No calvarial abnormality. Consider CT of the brain to assess further
if warranted..

## 2016-05-04 ENCOUNTER — Encounter: Payer: Self-pay | Admitting: Neurology

## 2016-05-04 ENCOUNTER — Ambulatory Visit (INDEPENDENT_AMBULATORY_CARE_PROVIDER_SITE_OTHER): Payer: Medicare Other | Admitting: Neurology

## 2016-05-04 ENCOUNTER — Telehealth: Payer: Self-pay | Admitting: Neurology

## 2016-05-04 ENCOUNTER — Other Ambulatory Visit: Payer: Self-pay | Admitting: Neurology

## 2016-05-04 VITALS — BP 157/75 | HR 46 | Ht 69.0 in | Wt 168.8 lb

## 2016-05-04 DIAGNOSIS — R419 Unspecified symptoms and signs involving cognitive functions and awareness: Secondary | ICD-10-CM

## 2016-05-04 NOTE — Progress Notes (Signed)
[facility]    Provider:  Dr Jaynee Eagles Referring Provider: Josetta Huddle, MD Primary Care Physician:  Henrine Screws, MD  CC:  Cognitive evaluation  HPI:  [name] is a 83 y.o. male here as a referral from Dr. Inda [name] for cognitive eval. PMHx of HLD, DM, CAD, Cancer. Patient is 64 and is still working as a Engineer, drilling. He denies any memory issues. He denies any accidents in the home or car. No falls. He performs his own medication management successfully.  Very social(He just threw a big [employer] day party.). No depression or anxiety. No accidents in the home or in car, not getting lost, no dififculty with medication management, No hallucination or delusions, Sleeps well no insomnia, he is the Medical laboratory scientific officer and pays the bills on time, there are no late bills, he manages his own taxes and they are paid on time, he cooks and has no difficulty with complex recipes or recipes from memory. He does not repeat questions in the same day, he always knows the day/date/month/year, he is fine in unfamiliar places, he manages his own finances, no trouble finding words or names. No other focal neurologic deficits, associated symptoms, inciting events or modifiable factors.  Review of Systems: Patient complains of symptoms per HPI as well as the following symptoms: no CP, no SOB. Pertinent negatives per HPI. All others negative.   Social History   Social History  . Marital status: Married    Spouse name: N/A  . Number of children: 2  . Years of education: MD   Occupational History  . [employer]    Social History Main Topics  . Smoking status: Never Smoker  . Smokeless tobacco: Never Used  . Alcohol use 6.0 oz/week    10 Standard drinks or equivalent per week     Comment: 1-2 drinks 4-5 days per week  . Drug use: No  . Sexual activity: Not on file   Other Topics Concern  . Not on file   Social History Narrative   Lives at home w/ his wife   Right-handed   Caffeine: 3-4 cups  per day    Family History  Problem Relation Age of Onset  . Heart attack Mother   . Prostate cancer Father   . Colon cancer Paternal Grandfather     Past Medical History:  Diagnosis Date  . Cancer [facility])    prostate with seeds  . Coronary artery disease   . Diabetes mellitus   . High cholesterol     Past Surgical History:  Procedure Laterality Date  . CATARACT EXTRACTION, BILATERAL  1998, 2002  . CORONARY ARTERY BYPASS GRAFT    . HERNIA REPAIR    . INSERTION PROSTATE RADIATION SEED    . LEG SURGERY      Current Outpatient Prescriptions  Medication Sig Dispense Refill  . albuterol (PROVENTIL HFA;VENTOLIN HFA) 108 (90 BASE) MCG/ACT inhaler Inhale 2 puffs into the lungs every 6 (six) hours as needed. For shortness of breath.    Marland Kitchen aspirin EC 81 MG tablet Take 81 mg by mouth daily.    Marland Kitchen atorvastatin (LIPITOR) 40 MG tablet Take 40 mg by mouth daily.    Marland Kitchen FLOVENT HFA 110 MCG/ACT inhaler Inhale 2 puffs into the lungs daily as needed.     . metFORMIN (GLUCOPHAGE) 500 MG tablet Take 500-1,000 mg by mouth at bedtime. Take 500 mg every morning Take 1000 mg at bedtime    . oxybutynin (DITROPAN-XL) 10 MG 24 hr  tablet Take 10 mg by mouth daily as needed (urinary).    . ranitidine (ZANTAC) 150 MG tablet Take 150 mg by mouth daily.     No current facility-administered medications for this visit.     Allergies as of 05/04/2016 - Review Complete 05/04/2016  Allergen Reaction Noted  . Sulfa antibiotics Hives 09/29/2011    Vitals: BP (!) 157/75   Pulse (!) 46   Ht 5\' 9"  (1.753 m)   Wt 168 lb 12.8 oz (76.6 kg)   BMI 24.93 kg/m  Last Weight:  Wt Readings from Last 1 Encounters:  05/04/16 168 lb 12.8 oz (76.6 kg)   Last Height:   Ht Readings from Last 1 Encounters:  05/04/16 5\' 9"  (1.753 m)    Physical exam: Exam: Gen: NAD, conversant, well nourised,  well groomed                     CV: RRR, no MRG. No Carotid Bruits. No peripheral edema, warm, nontender Eyes: Conjunctivae  clear without exudates or hemorrhage  Neuro: Detailed Neurologic Exam  Speech:    Speech is normal; fluent and spontaneous with normal comprehension.  Cognition:  Montreal Cognitive Assessment  05/04/2016  Visuospatial/ Executive (0/5) 5  Naming (0/3) 3  Attention: Read list of digits (0/2) 2  Attention: Read list of letters (0/1) 1  Attention: Serial 7 subtraction starting at 100 (0/3) 3  Language: Repeat phrase (0/2) 2  Language : Fluency (0/1) 1  Abstraction (0/2) 2  Delayed Recall (0/5) 5  Orientation (0/6) 6  Total 30   MMSE - Mini Mental State Exam 05/04/2016  Orientation to time 5  Orientation to Place 5  Registration 3  Attention/ Calculation 5  Recall 3  Language- name 2 objects 2  Language- repeat 1  Language- follow 3 step command 3  Language- read & follow direction 1  Write a sentence 1  Copy design 1  Total score 30      The patient is oriented to person, place, and time;     recent and remote memory intact;     language fluent;     normal attention, concentration,     fund of knowledge Cranial Nerves:    The pupils are equal, round, and reactive to light. The fundi are normal and spontaneous venous pulsations are present. Visual fields are full to finger confrontation. Extraocular movements are intact. Trigeminal sensation is intact and the muscles of mastication are normal. The face is symmetric. The palate elevates in the midline. Hearing intact. Voice is normal. Shoulder shrug is normal. The tongue has normal motion without fasciculations.   Coordination:    Normal finger to nose and heel to shin. Normal rapid alternating movements.   Gait:    Heel-toe and tandem gait are normal.   Motor Observation:    No asymmetry, no atrophy, and no involuntary movements noted. Tone:    Normal muscle tone.    Posture:    Posture is normal. normal erect    Strength:    Strength is V/V in the upper and lower limbs.      Sensation: intact to LT     Reflex  Exam:  DTR's:    Deep tendon reflexes in the upper and lower extremities are symmetrical bilaterally.   Toes:    The toes are downgoing bilaterally.   Clonus:    Clonus is absent.  Assessment/Plan:  This is a lovely, very well-spoken, lucid and intelligent patient here  for cognitive evaluation. His MMSE and MoCA were both perfect. His neurologic exam does not suggest any memory impairment or cognitive deficits.  Sarina Ill, MD  [facility] [address] [place], [place] [address]  Phone [phone] Fax [phone]  A total of 45 minutes was spent in with this patient. Over half this time was spent on counseling patient on the cognitive evaluation and different diagnostic options available.

## 2016-05-04 NOTE — Telephone Encounter (Signed)
Patient needs a letter

## 2016-05-04 NOTE — Patient Instructions (Signed)
Overall you are doing very well but I do want to suggest a few things today:   Remember to drink plenty of fluid, eat healthy meals and do not skip any meals. Try to eat protein with a every meal and eat a healthy snack such as fruit or nuts in between meals. Try to keep a regular sleep-wake schedule and try to exercise daily, particularly in the form of walking, 20-30 minutes a day, if you can.   Our phone number is [phone]. We also have an after hours call service for urgent matters and there is a physician on-call for urgent questions. For any emergencies you know to call 911 or go to the nearest emergency room

## 2016-05-05 DIAGNOSIS — I10 Essential (primary) hypertension: Secondary | ICD-10-CM | POA: Diagnosis not present

## 2016-05-06 ENCOUNTER — Ambulatory Visit: Payer: Self-pay | Admitting: Neurology

## 2016-05-06 LAB — METHYLMALONIC ACID, SERUM: Methylmalonic Acid: 180 nmol/L (ref 0–378)

## 2016-05-06 LAB — B12 AND FOLATE PANEL
FOLATE: 6.1 ng/mL (ref >3.0)
Vitamin B-12: 336 pg/mL (ref 232–1245)

## 2016-05-09 ENCOUNTER — Encounter: Payer: Self-pay | Admitting: Neurology

## 2016-05-09 NOTE — Telephone Encounter (Signed)
[name], can you print this letter for me and mail it to patient? Thanks!

## 2016-05-10 NOTE — Telephone Encounter (Signed)
Signed and placed in mail to pt.

## 2016-05-10 NOTE — Telephone Encounter (Signed)
Letter printed, awaiting MD signature.

## 2016-05-11 ENCOUNTER — Telehealth: Payer: Self-pay

## 2016-05-11 NOTE — Telephone Encounter (Signed)
Called pt w/ normal lab results. Also notified that letter was completed and mailed yesterday. May call back w/ additional questions/concerns.

## 2016-05-11 NOTE — Telephone Encounter (Signed)
-----   Message from Melvenia Beam, MD sent at 05/07/2016  1:19 PM EDT ----- Labs normal. Letter will be in the mail Monday thanks!

## 2016-05-17 NOTE — Telephone Encounter (Signed)
Patient called office would like letter he received to be redone with corrections: Patient would like Dr. Karsten Fells instead of Karsten Fells also requesting to place MD behind Dr. Cathren Laine signature.

## 2016-05-18 NOTE — Telephone Encounter (Signed)
Letter corrected as requested and mailed to patient.

## 2016-05-24 DIAGNOSIS — I10 Essential (primary) hypertension: Secondary | ICD-10-CM | POA: Diagnosis not present

## 2016-06-23 DIAGNOSIS — D485 Neoplasm of uncertain behavior of skin: Secondary | ICD-10-CM | POA: Diagnosis not present

## 2016-06-23 DIAGNOSIS — L570 Actinic keratosis: Secondary | ICD-10-CM | POA: Diagnosis not present

## 2016-06-23 DIAGNOSIS — D1801 Hemangioma of skin and subcutaneous tissue: Secondary | ICD-10-CM | POA: Diagnosis not present

## 2016-06-23 DIAGNOSIS — L821 Other seborrheic keratosis: Secondary | ICD-10-CM | POA: Diagnosis not present

## 2016-06-23 DIAGNOSIS — Z85828 Personal history of other malignant neoplasm of skin: Secondary | ICD-10-CM | POA: Diagnosis not present

## 2016-06-28 DIAGNOSIS — M722 Plantar fascial fibromatosis: Secondary | ICD-10-CM | POA: Diagnosis not present

## 2016-08-04 DIAGNOSIS — E782 Mixed hyperlipidemia: Secondary | ICD-10-CM | POA: Diagnosis not present

## 2016-08-04 DIAGNOSIS — Z0001 Encounter for general adult medical examination with abnormal findings: Secondary | ICD-10-CM | POA: Diagnosis not present

## 2016-08-04 DIAGNOSIS — I2510 Atherosclerotic heart disease of native coronary artery without angina pectoris: Secondary | ICD-10-CM | POA: Diagnosis not present

## 2016-08-04 DIAGNOSIS — H43812 Vitreous degeneration, left eye: Secondary | ICD-10-CM | POA: Diagnosis not present

## 2016-08-04 DIAGNOSIS — J4520 Mild intermittent asthma, uncomplicated: Secondary | ICD-10-CM | POA: Diagnosis not present

## 2016-08-04 DIAGNOSIS — H43811 Vitreous degeneration, right eye: Secondary | ICD-10-CM | POA: Diagnosis not present

## 2016-08-04 DIAGNOSIS — Z79899 Other long term (current) drug therapy: Secondary | ICD-10-CM | POA: Diagnosis not present

## 2016-08-04 DIAGNOSIS — I10 Essential (primary) hypertension: Secondary | ICD-10-CM | POA: Diagnosis not present

## 2016-08-04 DIAGNOSIS — Z1389 Encounter for screening for other disorder: Secondary | ICD-10-CM | POA: Diagnosis not present

## 2016-08-04 DIAGNOSIS — K219 Gastro-esophageal reflux disease without esophagitis: Secondary | ICD-10-CM | POA: Diagnosis not present

## 2016-08-04 DIAGNOSIS — J309 Allergic rhinitis, unspecified: Secondary | ICD-10-CM | POA: Diagnosis not present

## 2016-08-04 DIAGNOSIS — R0609 Other forms of dyspnea: Secondary | ICD-10-CM | POA: Diagnosis not present

## 2016-08-04 DIAGNOSIS — M1711 Unilateral primary osteoarthritis, right knee: Secondary | ICD-10-CM | POA: Diagnosis not present

## 2016-08-04 DIAGNOSIS — E119 Type 2 diabetes mellitus without complications: Secondary | ICD-10-CM | POA: Diagnosis not present

## 2016-08-04 DIAGNOSIS — E559 Vitamin D deficiency, unspecified: Secondary | ICD-10-CM | POA: Diagnosis not present

## 2016-08-04 DIAGNOSIS — Z8546 Personal history of malignant neoplasm of prostate: Secondary | ICD-10-CM | POA: Diagnosis not present

## 2016-08-31 DIAGNOSIS — M722 Plantar fascial fibromatosis: Secondary | ICD-10-CM | POA: Diagnosis not present

## 2016-10-19 DIAGNOSIS — Z85828 Personal history of other malignant neoplasm of skin: Secondary | ICD-10-CM | POA: Diagnosis not present

## 2016-10-19 DIAGNOSIS — L603 Nail dystrophy: Secondary | ICD-10-CM | POA: Diagnosis not present

## 2016-10-19 DIAGNOSIS — L723 Sebaceous cyst: Secondary | ICD-10-CM | POA: Diagnosis not present

## 2016-10-19 DIAGNOSIS — L720 Epidermal cyst: Secondary | ICD-10-CM | POA: Diagnosis not present

## 2016-11-02 DIAGNOSIS — E559 Vitamin D deficiency, unspecified: Secondary | ICD-10-CM | POA: Diagnosis not present

## 2016-11-02 DIAGNOSIS — I2510 Atherosclerotic heart disease of native coronary artery without angina pectoris: Secondary | ICD-10-CM | POA: Diagnosis not present

## 2016-11-02 DIAGNOSIS — Z79899 Other long term (current) drug therapy: Secondary | ICD-10-CM | POA: Diagnosis not present

## 2016-11-02 DIAGNOSIS — R0609 Other forms of dyspnea: Secondary | ICD-10-CM | POA: Diagnosis not present

## 2016-11-02 DIAGNOSIS — E119 Type 2 diabetes mellitus without complications: Secondary | ICD-10-CM | POA: Diagnosis not present

## 2016-11-02 DIAGNOSIS — Z23 Encounter for immunization: Secondary | ICD-10-CM | POA: Diagnosis not present

## 2016-11-02 DIAGNOSIS — J4520 Mild intermittent asthma, uncomplicated: Secondary | ICD-10-CM | POA: Diagnosis not present

## 2016-11-02 DIAGNOSIS — E782 Mixed hyperlipidemia: Secondary | ICD-10-CM | POA: Diagnosis not present

## 2016-11-02 DIAGNOSIS — R413 Other amnesia: Secondary | ICD-10-CM | POA: Diagnosis not present

## 2016-11-02 DIAGNOSIS — K219 Gastro-esophageal reflux disease without esophagitis: Secondary | ICD-10-CM | POA: Diagnosis not present

## 2016-11-02 DIAGNOSIS — M1711 Unilateral primary osteoarthritis, right knee: Secondary | ICD-10-CM | POA: Diagnosis not present

## 2016-11-02 DIAGNOSIS — Z8546 Personal history of malignant neoplasm of prostate: Secondary | ICD-10-CM | POA: Diagnosis not present

## 2016-11-02 DIAGNOSIS — J309 Allergic rhinitis, unspecified: Secondary | ICD-10-CM | POA: Diagnosis not present

## 2016-11-11 DIAGNOSIS — L723 Sebaceous cyst: Secondary | ICD-10-CM | POA: Diagnosis not present

## 2016-11-11 DIAGNOSIS — L570 Actinic keratosis: Secondary | ICD-10-CM | POA: Diagnosis not present

## 2016-11-11 DIAGNOSIS — D485 Neoplasm of uncertain behavior of skin: Secondary | ICD-10-CM | POA: Diagnosis not present

## 2016-11-11 DIAGNOSIS — Z85828 Personal history of other malignant neoplasm of skin: Secondary | ICD-10-CM | POA: Diagnosis not present

## 2016-11-11 DIAGNOSIS — L720 Epidermal cyst: Secondary | ICD-10-CM | POA: Diagnosis not present

## 2016-11-12 DIAGNOSIS — E119 Type 2 diabetes mellitus without complications: Secondary | ICD-10-CM | POA: Diagnosis not present

## 2016-11-12 DIAGNOSIS — Z794 Long term (current) use of insulin: Secondary | ICD-10-CM | POA: Diagnosis not present

## 2016-12-21 DIAGNOSIS — D2262 Melanocytic nevi of left upper limb, including shoulder: Secondary | ICD-10-CM | POA: Diagnosis not present

## 2016-12-21 DIAGNOSIS — Z85828 Personal history of other malignant neoplasm of skin: Secondary | ICD-10-CM | POA: Diagnosis not present

## 2016-12-21 DIAGNOSIS — D3617 Benign neoplasm of peripheral nerves and autonomic nervous system of trunk, unspecified: Secondary | ICD-10-CM | POA: Diagnosis not present

## 2016-12-21 DIAGNOSIS — D2261 Melanocytic nevi of right upper limb, including shoulder: Secondary | ICD-10-CM | POA: Diagnosis not present

## 2016-12-21 DIAGNOSIS — L821 Other seborrheic keratosis: Secondary | ICD-10-CM | POA: Diagnosis not present

## 2016-12-21 DIAGNOSIS — L570 Actinic keratosis: Secondary | ICD-10-CM | POA: Diagnosis not present

## 2017-02-24 DIAGNOSIS — L720 Epidermal cyst: Secondary | ICD-10-CM | POA: Diagnosis not present

## 2017-02-24 DIAGNOSIS — Z85828 Personal history of other malignant neoplasm of skin: Secondary | ICD-10-CM | POA: Diagnosis not present

## 2017-02-24 DIAGNOSIS — L570 Actinic keratosis: Secondary | ICD-10-CM | POA: Diagnosis not present

## 2017-02-24 DIAGNOSIS — D485 Neoplasm of uncertain behavior of skin: Secondary | ICD-10-CM | POA: Diagnosis not present

## 2017-02-24 DIAGNOSIS — L814 Other melanin hyperpigmentation: Secondary | ICD-10-CM | POA: Diagnosis not present

## 2017-03-09 DIAGNOSIS — K921 Melena: Secondary | ICD-10-CM | POA: Diagnosis not present

## 2017-03-22 DIAGNOSIS — R970 Elevated carcinoembryonic antigen [CEA]: Secondary | ICD-10-CM | POA: Diagnosis not present

## 2017-03-31 DIAGNOSIS — K640 First degree hemorrhoids: Secondary | ICD-10-CM | POA: Diagnosis not present

## 2017-03-31 DIAGNOSIS — I252 Old myocardial infarction: Secondary | ICD-10-CM | POA: Diagnosis not present

## 2017-03-31 DIAGNOSIS — D128 Benign neoplasm of rectum: Secondary | ICD-10-CM | POA: Diagnosis not present

## 2017-03-31 DIAGNOSIS — K5730 Diverticulosis of large intestine without perforation or abscess without bleeding: Secondary | ICD-10-CM | POA: Diagnosis not present

## 2017-03-31 DIAGNOSIS — I2510 Atherosclerotic heart disease of native coronary artery without angina pectoris: Secondary | ICD-10-CM | POA: Diagnosis not present

## 2017-03-31 DIAGNOSIS — D120 Benign neoplasm of cecum: Secondary | ICD-10-CM | POA: Diagnosis not present

## 2017-03-31 DIAGNOSIS — D126 Benign neoplasm of colon, unspecified: Secondary | ICD-10-CM | POA: Diagnosis not present

## 2017-03-31 DIAGNOSIS — K219 Gastro-esophageal reflux disease without esophagitis: Secondary | ICD-10-CM | POA: Diagnosis not present

## 2017-03-31 DIAGNOSIS — R970 Elevated carcinoembryonic antigen [CEA]: Secondary | ICD-10-CM | POA: Diagnosis not present

## 2017-03-31 DIAGNOSIS — Z7982 Long term (current) use of aspirin: Secondary | ICD-10-CM | POA: Diagnosis not present

## 2017-03-31 DIAGNOSIS — Z86010 Personal history of colonic polyps: Secondary | ICD-10-CM | POA: Diagnosis not present

## 2017-03-31 DIAGNOSIS — K644 Residual hemorrhoidal skin tags: Secondary | ICD-10-CM | POA: Diagnosis not present

## 2017-03-31 DIAGNOSIS — Z1211 Encounter for screening for malignant neoplasm of colon: Secondary | ICD-10-CM | POA: Diagnosis not present

## 2017-03-31 DIAGNOSIS — E119 Type 2 diabetes mellitus without complications: Secondary | ICD-10-CM | POA: Diagnosis not present

## 2017-03-31 DIAGNOSIS — K641 Second degree hemorrhoids: Secondary | ICD-10-CM | POA: Diagnosis not present

## 2017-03-31 DIAGNOSIS — D125 Benign neoplasm of sigmoid colon: Secondary | ICD-10-CM | POA: Diagnosis not present

## 2017-03-31 DIAGNOSIS — Z79899 Other long term (current) drug therapy: Secondary | ICD-10-CM | POA: Diagnosis not present

## 2017-03-31 DIAGNOSIS — Z7984 Long term (current) use of oral hypoglycemic drugs: Secondary | ICD-10-CM | POA: Diagnosis not present

## 2017-03-31 DIAGNOSIS — D122 Benign neoplasm of ascending colon: Secondary | ICD-10-CM | POA: Diagnosis not present

## 2017-03-31 DIAGNOSIS — D123 Benign neoplasm of transverse colon: Secondary | ICD-10-CM | POA: Diagnosis not present

## 2017-03-31 DIAGNOSIS — K635 Polyp of colon: Secondary | ICD-10-CM | POA: Diagnosis not present

## 2017-03-31 DIAGNOSIS — J45909 Unspecified asthma, uncomplicated: Secondary | ICD-10-CM | POA: Diagnosis not present

## 2017-03-31 DIAGNOSIS — D124 Benign neoplasm of descending colon: Secondary | ICD-10-CM | POA: Diagnosis not present

## 2017-03-31 DIAGNOSIS — K621 Rectal polyp: Secondary | ICD-10-CM | POA: Diagnosis not present

## 2017-03-31 DIAGNOSIS — Z955 Presence of coronary angioplasty implant and graft: Secondary | ICD-10-CM | POA: Diagnosis not present

## 2017-04-28 DIAGNOSIS — R970 Elevated carcinoembryonic antigen [CEA]: Secondary | ICD-10-CM | POA: Diagnosis not present

## 2017-04-28 DIAGNOSIS — Z125 Encounter for screening for malignant neoplasm of prostate: Secondary | ICD-10-CM | POA: Diagnosis not present

## 2017-07-05 DIAGNOSIS — L570 Actinic keratosis: Secondary | ICD-10-CM | POA: Diagnosis not present

## 2017-07-05 DIAGNOSIS — D485 Neoplasm of uncertain behavior of skin: Secondary | ICD-10-CM | POA: Diagnosis not present

## 2017-07-05 DIAGNOSIS — L820 Inflamed seborrheic keratosis: Secondary | ICD-10-CM | POA: Diagnosis not present

## 2017-07-05 DIAGNOSIS — Z85828 Personal history of other malignant neoplasm of skin: Secondary | ICD-10-CM | POA: Diagnosis not present

## 2017-07-05 DIAGNOSIS — L821 Other seborrheic keratosis: Secondary | ICD-10-CM | POA: Diagnosis not present

## 2017-07-19 DIAGNOSIS — R970 Elevated carcinoembryonic antigen [CEA]: Secondary | ICD-10-CM | POA: Diagnosis not present

## 2017-07-19 DIAGNOSIS — E7800 Pure hypercholesterolemia, unspecified: Secondary | ICD-10-CM | POA: Diagnosis not present

## 2017-07-26 DIAGNOSIS — H43812 Vitreous degeneration, left eye: Secondary | ICD-10-CM | POA: Diagnosis not present

## 2017-07-26 DIAGNOSIS — H18832 Recurrent erosion of cornea, left eye: Secondary | ICD-10-CM | POA: Diagnosis not present

## 2017-07-26 DIAGNOSIS — E119 Type 2 diabetes mellitus without complications: Secondary | ICD-10-CM | POA: Diagnosis not present

## 2017-07-26 DIAGNOSIS — H04123 Dry eye syndrome of bilateral lacrimal glands: Secondary | ICD-10-CM | POA: Diagnosis not present

## 2017-09-02 DIAGNOSIS — E559 Vitamin D deficiency, unspecified: Secondary | ICD-10-CM | POA: Diagnosis not present

## 2017-09-02 DIAGNOSIS — Z8546 Personal history of malignant neoplasm of prostate: Secondary | ICD-10-CM | POA: Diagnosis not present

## 2017-09-02 DIAGNOSIS — E782 Mixed hyperlipidemia: Secondary | ICD-10-CM | POA: Diagnosis not present

## 2017-09-02 DIAGNOSIS — E119 Type 2 diabetes mellitus without complications: Secondary | ICD-10-CM | POA: Diagnosis not present

## 2017-09-02 DIAGNOSIS — R970 Elevated carcinoembryonic antigen [CEA]: Secondary | ICD-10-CM | POA: Diagnosis not present

## 2017-09-02 DIAGNOSIS — I10 Essential (primary) hypertension: Secondary | ICD-10-CM | POA: Diagnosis not present

## 2017-09-02 DIAGNOSIS — Z0001 Encounter for general adult medical examination with abnormal findings: Secondary | ICD-10-CM | POA: Diagnosis not present

## 2017-09-02 DIAGNOSIS — I2510 Atherosclerotic heart disease of native coronary artery without angina pectoris: Secondary | ICD-10-CM | POA: Diagnosis not present

## 2017-09-02 DIAGNOSIS — Z79899 Other long term (current) drug therapy: Secondary | ICD-10-CM | POA: Diagnosis not present

## 2017-09-02 DIAGNOSIS — Z7984 Long term (current) use of oral hypoglycemic drugs: Secondary | ICD-10-CM | POA: Diagnosis not present

## 2017-09-02 DIAGNOSIS — R350 Frequency of micturition: Secondary | ICD-10-CM | POA: Diagnosis not present

## 2017-09-02 DIAGNOSIS — R413 Other amnesia: Secondary | ICD-10-CM | POA: Diagnosis not present

## 2017-09-12 DIAGNOSIS — J0410 Acute tracheitis without obstruction: Secondary | ICD-10-CM | POA: Diagnosis not present

## 2017-09-12 DIAGNOSIS — F418 Other specified anxiety disorders: Secondary | ICD-10-CM | POA: Diagnosis not present

## 2017-09-20 DIAGNOSIS — R310 Gross hematuria: Secondary | ICD-10-CM | POA: Diagnosis not present

## 2017-09-20 DIAGNOSIS — C61 Malignant neoplasm of prostate: Secondary | ICD-10-CM | POA: Diagnosis not present

## 2017-09-27 DIAGNOSIS — R062 Wheezing: Secondary | ICD-10-CM | POA: Diagnosis not present

## 2017-10-03 DIAGNOSIS — R062 Wheezing: Secondary | ICD-10-CM | POA: Diagnosis not present

## 2017-10-05 ENCOUNTER — Telehealth: Payer: Self-pay | Admitting: Internal Medicine

## 2017-10-05 NOTE — Telephone Encounter (Signed)
Received paperwork in envelope and placed it in MW look at  (cabinet). MW to review. I believe it is paperwork for his appt tomorrow.

## 2017-10-06 ENCOUNTER — Ambulatory Visit (INDEPENDENT_AMBULATORY_CARE_PROVIDER_SITE_OTHER): Payer: Medicare Other | Admitting: Internal Medicine

## 2017-10-06 ENCOUNTER — Encounter: Payer: Self-pay | Admitting: Internal Medicine

## 2017-10-06 VITALS — BP 138/78 | HR 66 | Ht 69.0 in | Wt 166.0 lb

## 2017-10-06 DIAGNOSIS — J45991 Cough variant asthma: Secondary | ICD-10-CM | POA: Diagnosis not present

## 2017-10-06 MED ORDER — BUDESONIDE-FORMOTEROL FUMARATE 80-4.5 MCG/ACT IN AERO: 2.0000 | INHALATION_SPRAY | Freq: Two times a day (BID) | RESPIRATORY_TRACT | 0 refills | Status: DC

## 2017-10-06 MED ORDER — PREDNISONE 10 MG PO TABS
ORAL_TABLET | ORAL | 0 refills | Status: DC
Start: 2017-10-06 — End: 2017-11-03

## 2017-10-06 MED ORDER — PANTOPRAZOLE SODIUM 40 MG PO TBEC: 40.0000 mg | DELAYED_RELEASE_TABLET | Freq: Every day | ORAL | 0 refills | Status: DC

## 2017-10-06 MED ORDER — BUDESONIDE-FORMOTEROL FUMARATE 80-4.5 MCG/ACT IN AERO: INHALATION_SPRAY | RESPIRATORY_TRACT | 11 refills | Status: DC

## 2017-10-06 NOTE — Progress Notes (Signed)
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   10/06/2017  First Pulmonary office eval for wheezing / upper chest/ throat tightness  Chief Complaint  Patient presents with  . Pulmonary Consult    persistent cough for a month, bronchosp, has tried allergy medications  without any relief, feels like the pollen played a factor, wheezing on expiration, hoarse voice, tightness in chest  Dyspnea:  No problem with ex bike Cough: none Sleep: ok if times symbiocort 160 late and takes again early in am  > reports feno low last week  SABA use: hardly at all on symb 160/tudorza   Started zantac benadryl allegra - no better and just using pppi at hs for gerd x 10 years   Even on prednisone he continued to need albuterol   No obvious day to day or daytime variability or assoc excess/ purulent sputum or mucus plugs or hemoptysis or cp   or overt sinus or hb symptoms.     Also denies any obvious fluctuation of symptoms with weather or environmental changes or other aggravating or alleviating factors except as outlined above   No unusual exposure hx or h/o childhood pna/ asthma or knowledge of premature birth.  Current Allergies, Complete Past Medical History, Past Surgical History, Family History, and Social History were reviewed in [employer] record.  ROS  The following are not active complaints unless bolded Hoarseness, sore throat, dysphagia, dental problems, itching, sneezing,  nasal congestion or discharge of excess mucus or purulent secretions, ear ache,   fever, chills, sweats, unintended wt loss or  wt gain, classically pleuritic or exertional cp,  orthopnea pnd or arm/hand swelling  or leg swelling, presyncope, palpitations, abdominal pain, anorexia, nausea, vomiting, diarrhea  or change in bowel habits or change in bladder habits, change in stools or change in urine, dysuria, hematuria,  rash, arthralgias, visual complaints, headache, numbness, weakness or ataxia or problems with walking or coordination,  change in mood or  memory.               Past Medical History:  Diagnosis Date  . Cancer [facility])    prostate with seeds  . Coronary artery disease   . Diabetes mellitus   . High cholesterol     Outpatient Medications Prior to Visit  Medication Sig Dispense Refill  . albuterol (PROVENTIL HFA;VENTOLIN HFA) 108 (90 BASE) MCG/ACT inhaler Inhale 2 puffs into the lungs every 6 (six) hours as needed. For shortness of breath.    Marland Kitchen aspirin EC 81 MG tablet Take 81 mg by mouth daily.    Marland Kitchen atorvastatin (LIPITOR) 40 MG tablet Take 40 mg by mouth daily.    Marland Kitchen FLOVENT HFA 110 MCG/ACT inhaler Inhale 2 puffs into the lungs daily as needed.     . metFORMIN (GLUCOPHAGE) 500 MG tablet Take 500-1,000 mg by mouth at bedtime. Take 500 mg every morning Take 1000 mg at bedtime    . oxybutynin (DITROPAN-XL) 10 MG 24 hr tablet Take 10 mg by mouth daily as needed (urinary).    . ranitidine (ZANTAC) 150 MG  tablet Take 150 mg by mouth daily.     No facility-administered medications prior to visit.             Objective:     BP 138/78 (BP Location: Left Arm, Cuff Size: Normal)   Pulse 66   Ht 5\' 9"  (1.753 m)   Wt 166 lb (75.3 kg)   SpO2 95%   BMI 24.51 kg/m   SpO2: 95 % RA  Well preserved amb slt hoarse wm nad / pseudowheeze only    HEENT: nl dentition, turbinates bilaterally, and oropharynx. Nl external ear canals without cough reflex   NECK :  without JVD/Nodes/TM/ nl carotid upstrokes bilaterally   LUNGS: no acc muscle use,  Nl contour chest which is clear to A and P bilaterally  without cough on insp or exp maneuvers   CV:  RRR  no s3 or murmur or increase in P2, and no edema   ABD:  soft and nontender with nl inspiratory excursion in the supine position. No bruits or organomegaly appreciated, bowel sounds nl  MS:  Nl gait/ ext warm without deformities, calf tenderness, cyanosis or clubbing No obvious joint restrictions   SKIN: warm and dry without lesions    NEURO:  alert, approp, nl sensorium with  no motor or cerebellar deficits apparent.       Assessment   Cough variant asthma Spirometry 10/06/2017  FEV1 2.77 (104%)  Ratio 66  With min curvature p symbicort 160  - 10/06/2017  After extensive coaching inhaler device,  effectiveness =    90% try symb 80 2bid and max rx for gerd    DDX of  difficult airways management almost all start with A and  include Adherence, Ace Inhibitors, Acid Reflux, Active Sinus Disease, Alpha 1 Antitripsin deficiency, Anxiety masquerading as Airways dz,  ABPA,  Allergy(esp in young), Aspiration (esp in elderly), Adverse effects of meds,  Active smokers, A bunch of PE's (a small clot burden can't cause this syndrome unless there is already severe underlying pulm or vascular dz with poor reserve) plus two Bs  = Bronchiectasis and Beta blocker use..and one C= CHF  Adherence is always the initial "prime suspect" and is a multilayered concern that requires a "trust but verify" approach in every patient - starting with knowing how to use medications, especially inhalers, correctly, keeping up with refills and understanding the fundamental difference between maintenance and prns vs those medications only taken for a very short course and then stopped and not refilled.  - see hfa instructions  ? Acid (or non-acid) GERD > always difficult to exclude as up to 75% of pts in some series report no assoc GI/ Heartburn symptoms> rec max (24h)  acid suppression and diet restrictions/ reviewed and instructions given in writing.  - Of the three most  common causes of  Sub-acute / recurrent or chronic cough, only one (GERD)  can actually contribute to/ trigger  the other two (asthma and post nasal drip syndrome)  and perpetuate the cylce of cough.  While not intuitively obvious, many patients with chronic low grade reflux do not cough until there is a primary insult that disturbs the protective epithelial barrier and exposes sensitive nerve endings.   This is typically viral but can due to PNDS and  either may apply here.   The point is that once this occurs, it is difficult to eliminate the cycle  using anything but a maximally effective acid suppression regimen at least in the short run,  accompanied by an appropriate diet to address non acid GERD > try ppi q am ahd h2 hs plus prn 1st gen H1 blockers per guidelines     ? Allergic asthma> low feno suggests this is addressed by symb 160 and the pseudowheeze may be related to ics > try symb 80 2bid and Prednisone 10 mg take  4 each am x 2 days,   2 each am x 2 days,  1 each am x 2 days and stop   ? Adverse effects of dpi > try off tudorza   ? Active sinus dz > does have evidence of non-specific rhinitis > consider sinus CT next ov     Total time devoted to counseling  > 50 % of initial 60 min office visit:  review case with pt/ discussion of options/alternatives/ personally creating written customized instructions  in presence of pt  then going over those specific  Instructions directly with the pt including how to use all of the meds but in particular covering each new medication in detail and the difference between the maintenance= "automatic" meds and the prns using an action plan format for the latter (If this problem/symptom => do that organization reading Left to right).  Please see AVS from this visit for a full list of these instructions which I personally wrote for this pt and  are unique to this visit.   See device teaching which extended face to face time for this visit       Christinia Gully, MD 10/06/2017

## 2017-10-06 NOTE — Patient Instructions (Addendum)
Prednisone 10 mg take  4 each am x 2 days,   2 each am x 2 days,  1 each am x 2 days and stop   Symbicort 80 Take 2 puffs first thing in am and then another 2 puffs about 12 hours later.   Work on inhaler technique:  relax and gently blow all the way out then take a nice smooth deep breath back in, triggering the inhaler at same time you start breathing in.  Hold for up to 5 seconds if you can. Blow out thru nose. Rinse and gargle with water when done  Pantoprazole (protonix) 40 mg   Take  30-60 min before first meal of the day and Zantac 150  one @  bedtime until return to office - this is the best way to tell whether stomach acid is contributing to your problem.    GERD (REFLUX)  is an extremely common cause of respiratory symptoms just like yours , many times with no obvious heartburn at all.    It can be treated with medication, but also with lifestyle changes including elevation of the head of your bed (ideally with 6 inch  bed blocks),  Smoking cessation, avoidance of late meals, excessive alcohol, and avoid fatty foods, chocolate, peppermint, colas, red wine, and acidic juices such as orange juice.  NO MINT OR MENTHOL PRODUCTS SO NO COUGH DROPS   USE SUGARLESS CANDY INSTEAD (Jolley ranchers or Stover's or Life Savers) or even ice chips will also do - the key is to swallow to prevent all throat clearing. NO OIL BASED VITAMINS - use powdered substitutes.  For drainage / throat tickle try take CHLORPHENIRAMINE  4 mg - take one every 4 hours as needed - available over the counter- may cause drowsiness so start with just a bedtime dose or two and see how you tolerate it before trying in daytime     Stop tudorza / flovent as you already have   Please schedule a follow up office visit in 4 weeks, sooner if needed                .

## 2017-10-06 NOTE — Assessment & Plan Note (Signed)
Spirometry 10/06/2017  FEV1 2.77 (104%)  Ratio 66  With min curvature p symbicort 160  - 10/06/2017  After extensive coaching inhaler device,  effectiveness =    90% try symb 80 2bid and max rx for gerd    DDX of  difficult airways management almost all start with A and  include Adherence, Ace Inhibitors, Acid Reflux, Active Sinus Disease, Alpha 1 Antitripsin deficiency, Anxiety masquerading as Airways dz,  ABPA,  Allergy(esp in young), Aspiration (esp in elderly), Adverse effects of meds,  Active smokers, A bunch of PE's (a small clot burden can't cause this syndrome unless there is already severe underlying pulm or vascular dz with poor reserve) plus two Bs  = Bronchiectasis and Beta blocker use..and one C= CHF  Adherence is always the initial "prime suspect" and is a multilayered concern that requires a "trust but verify" approach in every patient - starting with knowing how to use medications, especially inhalers, correctly, keeping up with refills and understanding the fundamental difference between maintenance and prns vs those medications only taken for a very short course and then stopped and not refilled.  - see hfa instructions  ? Acid (or non-acid) GERD > always difficult to exclude as up to 75% of pts in some series report no assoc GI/ Heartburn symptoms> rec max (24h)  acid suppression and diet restrictions/ reviewed and instructions given in writing.  - Of the three most common causes of  Sub-acute / recurrent or chronic cough, only one (GERD)  can actually contribute to/ trigger  the other two (asthma and post nasal drip syndrome)  and perpetuate the cylce of cough.  While not intuitively obvious, many patients with chronic low grade reflux do not cough until there is a primary insult that disturbs the protective epithelial barrier and exposes sensitive nerve endings.   This is typically viral but can due to PNDS and  either may apply here.   The point is that once this occurs, it is  difficult to eliminate the cycle  using anything but a maximally effective acid suppression regimen at least in the short run, accompanied by an appropriate diet to address non acid GERD > try ppi q am ahd h2 hs plus prn 1st gen H1 blockers per guidelines     ? Allergic asthma> low feno suggests this is addressed by symb 160 and the pseudowheeze may be related to ics > try symb 80 2bid and Prednisone 10 mg take  4 each am x 2 days,   2 each am x 2 days,  1 each am x 2 days and stop   ? Adverse effects of dpi > try off tudorza   ? Active sinus dz > does have evidence of non-specific rhinitis > consider sinus CT next ov     Total time devoted to counseling  > 50 % of initial 60 min office visit:  review case with pt/ discussion of options/alternatives/ personally creating written customized instructions  in presence of pt  then going over those specific  Instructions directly with the pt including how to use all of the meds but in particular covering each new medication in detail and the difference between the maintenance= "automatic" meds and the prns using an action plan format for the latter (If this problem/symptom => do that organization reading Left to right).  Please see AVS from this visit for a full list of these instructions which I personally wrote for this pt and  are unique to this  visit.   See device teaching which extended face to face time for this visit

## 2017-10-07 NOTE — Telephone Encounter (Signed)
Called spoke with patient, advised of MW's response below. Patient voiced his understanding and appreciation of MW reviewing the paperwork. Nothing further needed at this time; will sign off.

## 2017-10-07 NOTE — Telephone Encounter (Signed)
Let pt know I did receive it/ review it but do not feel change in recs needed based on anything I reviewed

## 2017-10-07 NOTE — Telephone Encounter (Signed)
MW please advise if this paperwork has been completed. Thank you.

## 2017-10-20 DIAGNOSIS — J302 Other seasonal allergic rhinitis: Secondary | ICD-10-CM | POA: Diagnosis not present

## 2017-10-20 DIAGNOSIS — Z23 Encounter for immunization: Secondary | ICD-10-CM | POA: Diagnosis not present

## 2017-10-27 DIAGNOSIS — J9801 Acute bronchospasm: Secondary | ICD-10-CM | POA: Diagnosis not present

## 2017-10-27 DIAGNOSIS — J383 Other diseases of vocal cords: Secondary | ICD-10-CM | POA: Diagnosis not present

## 2017-10-28 ENCOUNTER — Encounter: Payer: Self-pay | Admitting: Allergy

## 2017-11-03 ENCOUNTER — Encounter: Payer: Self-pay | Admitting: Internal Medicine

## 2017-11-03 ENCOUNTER — Ambulatory Visit (INDEPENDENT_AMBULATORY_CARE_PROVIDER_SITE_OTHER): Payer: Medicare Other | Admitting: Internal Medicine

## 2017-11-03 VITALS — BP 124/80 | HR 60 | Ht 68.0 in | Wt 165.4 lb

## 2017-11-03 DIAGNOSIS — J45991 Cough variant asthma: Secondary | ICD-10-CM | POA: Diagnosis not present

## 2017-11-03 LAB — NITRIC OXIDE: Nitric Oxide: 12

## 2017-11-03 NOTE — Patient Instructions (Signed)
Continue the acid suppression at least until you see Dr Neldon Mc    Continue For drainage / throat tickle try take CHLORPHENIRAMINE  4 mg - take one every 4 hours as needed - available over the counter- may cause drowsiness so start with just a bedtime dose or two and see how you tolerate it before trying in daytime     Only use your albuterol as a rescue medication to be used if you can't catch your breath by resting or doing a relaxed purse lip breathing pattern.  - The less you use it, the better it will work when you need it. - Ok to use up to 2 puffs  every 4 hours if you must but call for immediate appointment if use goes up over your usual need - Don't leave home without it !!  (think of it like the spare tire for your car)    Pulmonary follow up is as needed

## 2017-11-03 NOTE — Assessment & Plan Note (Addendum)
Spirometry 10/06/2017  FEV1 2.77 (104%)  Ratio 66  With min curvature p symbicort 160  - 10/06/2017  After extensive coaching inhaler device,  effectiveness =    90% try symb 80 2bid and max rx for gerd   - Spirometry 11/03/2017  FEV1 2.5 (99%)  Ratio 66 with mild curvature off all  maint rx for asthma since 10/09/17) except for gerd rx  FENO 11/03/2017  =   12   - The proper method of use, as well as anticipated side effects, of a metered-dose inhaler are discussed and demonstrated to the patient Using teach back.  He clearly has asthma but the symptoms are predominantly upper airway and respond better to chlorpheniramine than low dose symbicort strongly supporting dx of  Upper airway cough syndrome (previously labeled PNDS),  is so named because it's frequently impossible to sort out how much is  CR/sinusitis with freq throat clearing (which can be related to primary GERD)   vs  causing  secondary (" extra esophageal")  GERD from wide swings in gastric pressure that occur with throat clearing, often  promoting self use of mint and menthol lozenges that reduce the lower esophageal sphincter tone and exacerbate the problem further in a cyclical fashion.   These are the same pts (now being labeled as having "irritable larynx syndrome" by some cough centers) who not infrequently have a history of having failed to tolerate ace inhibitors,  dry powder inhalers or biphosphonates or report having atypical/extraesophageal reflux symptoms that don't respond to standard doses of PPI  and are easily confused as having aecopd or asthma flares by even experienced allergists/ pulmonologists (myself included).   For now since now even needing albuterol will hold off adding back symbicort and just treat in the short run with saba prn and perhaps short term prednisone if breaking the rule of two's but continue 1st gen H1 blockers per guidelines  (subject to stopping prior to allergy eval per Dr Bruna Potter instructions) and max  rx for gerd     I had an extended discussion with the patient reviewing all relevant studies completed to date and  lasting 15 to 20 minutes of a 25 minute visit    See device teaching which extended face to face time for this visit.  Each maintenance medication was reviewed in detail including emphasizing most importantly the difference between maintenance and prns and under what circumstances the prns are to be triggered using an action plan format that is not reflected in the computer generated alphabetically organized AVS which I have not found useful in most complex patients, especially with respiratory illnesses  Please see AVS for specific instructions unique to this visit that I personally wrote and verbalized to the the pt in detail and then reviewed with pt  by my nurse highlighting any  changes in therapy recommended at today's visit to their plan of care.

## 2017-11-03 NOTE — Progress Notes (Signed)
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    History of Present Illness  10/06/2017  First Pulmonary office eval for wheezing / upper chest/ throat tightness  Chief Complaint  Patient presents with  . Pulmonary Consult    persistent cough for a month, bronchosp, has tried allergy medications  without any relief, feels like the pollen played a factor, wheezing on expiration, hoarse voice, tightness in chest  Dyspnea:  No problem with ex bike Cough: no excess mucus  Sleep: ok if times symbiocort 160 late and takes again early in am  > reports feno low last week  SABA use: hardly at all on symb 160/tudorza  Started zantac benadryl allegra - no better and just using pppi at [facility] for gerd x 10 years  Even on prednisone he continued to need albuterol  rec Prednisone 10 mg take  4 each am x 2 days,   2 each am x 2 days,  1 each am x 2 days and stop  Symbicort 80 Take 2 puffs first thing in am and then another 2 puffs about 12 hours later.  Work on inhaler technique:  relax and gently blow all the way out then take a nice smooth deep breath back in, triggering the inhaler at same time you start breathing in.  Hold for up to 5 seconds if you can. Blow out thru nose. Rinse and gargle with water when done Pantoprazole (protonix) 40 mg   Take  30-60 min before first meal of the day and Zantac 150  one @  bedtime until return to office - this is the best way to tell whether stomach acid is contributing to your problem.   GERD diet  For drainage / throat tickle try take CHLORPHENIRAMINE  4 mg - take one every 4 hours as needed      Stop tudorza / flovent as you already have Please schedule a follow up office visit in 4 weeks, sooner if needed      11/03/2017  f/u ov/Wert re:  Asthma vs uacs related to pnds  - stopped symbicort 80 2bid 10/09/17 p using it up to qid and developed worse hoarseness/ cough/ wheeze better when stopped it (though this was about day 3 of pred rx, pt convinced it was the weather change that helped)  still on gerd rx  Chief Complaint  Patient presents with  . Follow-up    He states PCP told him he was "overmedicated"- and so he d/ced med 3 days after the last visit. He states the symbicort just was not working. A rain storm came and all of his symptoms basically resolved.   very confusing as to what he took/ didn't take since last ov ent eval by [facility] neg  To see Dr Neldon Mc in one week  Best response for cough  is to chlorpheniramine and albuterol   Last saba 36 h prior to OV  / chlorpheniramine really helps but not needed "when the air is clear " eg when traveled to the mountains    No obvious day to day or  daytime variability or assoc excess/ purulent sputum or mucus plugs or hemoptysis or cp or chest tightness,   or overt sinus or hb symptoms.   sleepin ok now  without nocturnal  or early am exacerbation  of respiratory  c/o's or need for noct saba. Also denies any obvious fluctuation of symptoms with weather or environmental changes or other aggravating or alleviating factors except as outlined above   No unusual exposure hx or h/o childhood pna/ asthma or knowledge of premature birth.  Current Allergies, Complete Past Medical History, Past Surgical History, Family History, and Social History were reviewed in [employer] record.  ROS  The following are not active complaints unless bolded Hoarseness, sore throat, dysphagia, dental problems, itching, sneezing,  nasal congestion or discharge of excess mucus or purulent secretions, ear ache,   fever, chills, sweats,  unintended wt loss or wt gain, classically pleuritic or exertional cp,  orthopnea pnd or arm/hand swelling  or leg swelling, presyncope, palpitations, abdominal pain, anorexia, nausea, vomiting, diarrhea  or change in bowel habits or change in bladder habits, change in stools or change in urine, dysuria, hematuria,  rash, arthralgias, visual complaints, headache, numbness, weakness or ataxia or problems with walking or coordination,  change in mood or  memory.        Current Meds  Medication Sig  . albuterol (PROVENTIL HFA;VENTOLIN HFA) 108 (90 BASE) MCG/ACT inhaler Inhale 2 puffs into the lungs every 6 (six) hours as needed. For shortness of breath.  Marland Kitchen aspirin EC 81 MG tablet Take 81 mg by mouth daily.  Marland Kitchen atorvastatin (LIPITOR) 40 MG tablet Take 40 mg by mouth daily.  . metFORMIN (GLUCOPHAGE) 500 MG tablet Take 500-1,000 mg by mouth at bedtime. Take 500 mg every morning Take 1000 mg at bedtime  . MYRBETRIQ 50 MG TB24 tablet Take 1 tablet by mouth daily.  . pantoprazole (PROTONIX) 40 MG tablet Take 1 tablet (40 mg total) by mouth daily. Take 30-60 min before first meal of the day  . ranitidine (ZANTAC) 150 MG tablet Take 150 mg by mouth daily.               Objective:     amb wm nad   Wt Readings from Last 3 Encounters:  11/03/17 165 lb 6.4 oz (75 kg)  10/06/17 166 lb (75.3 kg)  05/04/16 168 lb 12.8 oz (76.6 kg)     Vital signs reviewed - Note on arrival 02 sats  97% on RA     HEENT: nl dentition, turbinates bilaterally, and oropharynx. Nl external ear canals without cough reflex   NECK :  without JVD/Nodes/TM/ nl carotid upstrokes bilaterally   LUNGS: no acc muscle use,  Nl contour chest with trace wheeze on exp/better with plm, no assoc cough   CV:  RRR  no s3 or murmur or increase in P2, and no edema   ABD:  soft and nontender with nl inspiratory excursion in the supine position. No bruits or organomegaly appreciated, bowel sounds nl  MS:  Nl gait/ ext warm without  deformities, calf tenderness, cyanosis or clubbing No obvious joint restrictions   SKIN: warm and dry without lesions    NEURO:  alert, approp, nl sensorium with  no motor or cerebellar deficits apparent.                Assessment

## 2017-11-05 ENCOUNTER — Encounter: Payer: Self-pay | Admitting: Internal Medicine

## 2017-11-10 ENCOUNTER — Ambulatory Visit: Payer: Medicare Other | Admitting: Allergy and Immunology

## 2017-11-10 DIAGNOSIS — H1852 Epithelial (juvenile) corneal dystrophy: Secondary | ICD-10-CM | POA: Diagnosis not present

## 2017-11-10 DIAGNOSIS — H18839 Recurrent erosion of cornea, unspecified eye: Secondary | ICD-10-CM | POA: Diagnosis not present

## 2017-11-10 DIAGNOSIS — H16142 Punctate keratitis, left eye: Secondary | ICD-10-CM | POA: Diagnosis not present

## 2017-12-08 ENCOUNTER — Ambulatory Visit: Payer: Medicare Other | Admitting: Allergy and Immunology

## 2017-12-12 DIAGNOSIS — E7800 Pure hypercholesterolemia, unspecified: Secondary | ICD-10-CM | POA: Diagnosis not present

## 2017-12-12 DIAGNOSIS — J45901 Unspecified asthma with (acute) exacerbation: Secondary | ICD-10-CM | POA: Diagnosis not present

## 2017-12-15 ENCOUNTER — Encounter: Payer: Self-pay | Admitting: Allergy and Immunology

## 2017-12-15 ENCOUNTER — Ambulatory Visit (INDEPENDENT_AMBULATORY_CARE_PROVIDER_SITE_OTHER): Payer: Medicare Other | Admitting: Allergy and Immunology

## 2017-12-15 VITALS — BP 126/86 | HR 68 | Resp 16 | Ht 68.5 in | Wt 170.0 lb

## 2017-12-15 DIAGNOSIS — K219 Gastro-esophageal reflux disease without esophagitis: Secondary | ICD-10-CM | POA: Diagnosis not present

## 2017-12-15 DIAGNOSIS — J4552 Severe persistent asthma with status asthmaticus: Secondary | ICD-10-CM

## 2017-12-15 DIAGNOSIS — J3089 Other allergic rhinitis: Secondary | ICD-10-CM

## 2017-12-15 DIAGNOSIS — R05 Cough: Secondary | ICD-10-CM | POA: Diagnosis not present

## 2017-12-15 MED ORDER — MOMETASONE FURO-FORMOTEROL FUM 200-5 MCG/ACT IN AERO: INHALATION_SPRAY | RESPIRATORY_TRACT | 5 refills | Status: DC

## 2017-12-15 MED ORDER — ESOMEPRAZOLE MAGNESIUM 40 MG PO CPDR: DELAYED_RELEASE_CAPSULE | ORAL | 5 refills | Status: DC

## 2017-12-15 MED ORDER — FAMOTIDINE 40 MG PO TABS: ORAL_TABLET | ORAL | 5 refills | Status: DC

## 2017-12-15 NOTE — Patient Instructions (Addendum)
  1.  Allergen avoidance measures  2.  Treat and prevent inflammation:   A.  Dulera 200 -2 inhalations twice a day with spacer  B.  OTC Nasacort -1 spray each nostril twice a day  3.  Treat and prevent reflux:   A.  Consolidate alcohol consumption  B.  Esomeprazole 40mg  tablet in a.m.  C.  Famotidine 40 mg tablet in p.m.  4.  If needed:   A.  Albuterol MDI 2 inhalations every 4-6 hours  5.  Prednisone 10 mg tablet daily   6.  Obtain Blood - CBC w/diff, IgE. Biological agent?  7. Obtain Chest X-Ray  8.  Return to clinic in 2 weeks or earlier if problem

## 2017-12-15 NOTE — Progress Notes (Signed)
Dear Dr. Erik Obey,  Thank you for referring [name] to the [facility] on 12/15/2017.   Below is a summation of this patient's evaluation and recommendations.  Thank you for your referral. I will keep you informed about this patient's response to treatment.   If you have any questions please do not hesitate to contact me.   Sincerely,  Jiles Prows, MD Allergy / Immunology [facility]   ______________________________________________________________________    NEW PATIENT NOTE  Referring Provider: Jodi Marble, MD Primary Provider: Josetta Huddle, MD Date of office visit: 12/15/2017    Subjective:   Chief Complaint:  [name] (DOB: [birth year]) is a 84 y.o. male who presents to the clinic on 12/15/2017 with a chief complaint of Asthma .     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  His history dates back to early childhood as he developed asthma at a very early age which over the course of the past 20 years he felt was under excellent control with the use of Flovent utilized on a consistent basis and the rare use of a short acting bronchodilator.   Towards the tail end of June 2019 he had a change in his pattern of asthma and he has had recurrent episodes of wheezing and coughing with both a inspiratory and expiratory component with production of Notz plugs that was not responsive to a short acting bronchodilator requiring him to use systemic steroids on and off over the course of the past 3 months.  In questioning his prednisone use it sounds as though he has been using prednisone on a very frequent basis with high doses and tapering doses basically adding up to about 50% of the days that existed over the past 3 months with prednisone use.  He does respond to systemic steroids and resolves his respiratory issue while utilizing this medication.  He  does have GERD and it gets active when he uses prednisone.  He only uses Pepto-Bismol at this point to treat this condition.  He does not consume caffeine but he does drink a few glasses of wine or vodka on a daily basis.  He eats chocolate about twice a week.  He does have issues with nasal congestion for which he will use Afrin just about every other day from August through September which he feels is the worst time of the year for his upper airway symptoms.  He believes that he has sensitivity to ragweed.  Treatment in the past has included evaluation with Dr. Melvyn Novas and Dr. Erik Obey and he has had imaging studies which did not identify any significant structural abnormality of his airway and he has been treated with a multitude of different anti-inflammatory agents in the form of inhalers, none of which have helped him to any degree.  Once again, as noted above, systemic steroids appears to be the only medication that gives rise to improvement regarding his persistent respiratory tract symptoms.  Exactly why he has had more activity of his airway issues since June 2019 is not clearly elucidated.  There has not really been a significant environmental change that has occurred.  He is not on any new medications that may account for this issue.  His last chest x-ray was over 2 years ago.  Past Medical History:  Diagnosis Date  . Asthma   . 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 Coronary artery disease   . Diabetes mellitus   . High cholesterol     Past Surgical History:  Procedure Laterality Date  . CATARACT EXTRACTION, BILATERAL  1998, 2002  . CORONARY ARTERY BYPASS GRAFT    . HERNIA REPAIR    . INSERTION PROSTATE RADIATION SEED    . LEG SURGERY      Allergies as of 12/15/2017      Reactions   Lisinopril Cough   Sulfa Antibiotics Hives      Medication List      albuterol 108 (90 Base) MCG/ACT inhaler Commonly known as:  PROVENTIL HFA;VENTOLIN HFA Inhale 2 puffs into the lungs  every 6 (six) hours as needed. For shortness of breath.   aspirin EC 81 MG tablet Take 81 mg by mouth daily.   atorvastatin 40 MG tablet Commonly known as:  LIPITOR Take 40 mg by mouth daily.   FLOVENT HFA 110 MCG/ACT inhaler Generic drug:  fluticasone   metFORMIN 500 MG tablet Commonly known as:  GLUCOPHAGE Take 500-1,000 mg by mouth at bedtime. Take 500 mg every morning Take 1000 mg at bedtime   MYRBETRIQ 50 MG Tb24 tablet Generic drug:  mirabegron ER Take 1 tablet by mouth daily.   pantoprazole 40 MG tablet Commonly known as:  PROTONIX Take 1 tablet (40 mg total) by mouth daily. Take 30-60 min before first meal of the day   predniSONE 20 MG tablet Commonly known as:  DELTASONE       Review of systems negative except as noted in HPI / PMHx or noted below:  Review of Systems  Constitutional: Negative.   HENT: Negative.   Eyes: Negative.   Respiratory: Negative.   Cardiovascular: Negative.   Gastrointestinal: Negative.   Genitourinary: Negative.   Musculoskeletal: Negative.   Skin: Negative.   Neurological: Negative.   Endo/Heme/Allergies: Negative.   Psychiatric/Behavioral: Negative.     Family History  Problem Relation Age of Onset  . Heart attack Mother   . Prostate cancer Father   . Colon cancer Paternal Grandfather     Social History   Socioeconomic History  . Marital status: Married    Spouse name: Not on file  . Number of children: 2  . Years of education: MD  . Highest education level: Not on file  Occupational History  . Occupation: [employer]  Social Needs  . Financial resource strain: Not on file  . Food insecurity:    Worry: Not on file    Inability: Not on file  . Transportation needs:    Medical: Not on file    Non-medical: Not on file  Tobacco Use  . Smoking status: Never Smoker  . Smokeless tobacco: Never Used  Substance and Sexual Activity  . Alcohol use: Yes    Alcohol/week: 10.0 standard drinks    Types: 10 Standard drinks or  equivalent per week    Comment: 1-2 drinks 4-5 days per week  . Drug use: No  . Sexual activity: Not on file  Lifestyle  . Physical activity:    Days per week: Not on file    Minutes per session: Not on file  . Stress: Not on file  Relationships  . Social connections:    Talks on phone: Not on file    Gets together: Not on file    Attends religious service: Not on file    Active member of club or organization: Not on file    Attends meetings of clubs or  organizations: Not on file    Relationship status: Not on file  . Intimate partner violence:    Fear of current or ex partner: Not on file    Emotionally abused: Not on file    Physically abused: Not on file    Forced sexual activity: Not on file  Other Topics Concern  . Not on file  Social History Narrative   Lives at home w/ his wife   Right-handed   Caffeine: 3-4 cups per day    Environmental and Social history  Lives in a house with a dry environment, no animals located inside the household, no carpet in the bedroom, no plastic on the bed, no plastic on the pillow, no smokers located inside the household.  Objective:   Vitals:   12/15/17 0931  BP: 126/86  Pulse: 68  Resp: 16   Height: 5' 8.5" (174 cm) Weight: 170 lb (77.1 kg)   Physical Exam  HENT:  Head: Normocephalic.  Right Ear: Tympanic membrane, external ear and ear canal normal.  Left Ear: Tympanic membrane, external ear and ear canal normal.  Nose: Nose normal. No mucosal edema or rhinorrhea.  Mouth/Throat: Uvula is midline, oropharynx is clear and moist and mucous membranes are normal. No oropharyngeal exudate.  Eyes: Conjunctivae are normal.  Neck: Trachea normal. No tracheal tenderness present. No tracheal deviation present. No thyromegaly present.  Cardiovascular: Normal rate, regular rhythm, S1 normal, S2 normal and normal heart sounds.  No murmur heard. Pulmonary/Chest: Breath sounds normal. No stridor. No respiratory distress. He has no  wheezes. He has no rales.  Musculoskeletal: He exhibits no edema.  Lymphadenopathy:       Head (right side): No tonsillar adenopathy present.       Head (left side): No tonsillar adenopathy present.    He has no cervical adenopathy.  Neurological: He is alert.  Skin: No rash noted. He is not diaphoretic. No erythema. Nails show no clubbing.    Diagnostics: Allergy skin tests were performed.  He demonstrated hypersensitivity to grasses, trees, weeds, molds, and dust mite.  Spirometry was performed and demonstrated an FEV1 of 2.67 @ 101 % of predicted. FEV1/FVC = 0.68.  Following administration of nebulized albuterol his FEV1 did not improve.  Assessment and Plan:    1. Steroid-dependent asthma, severe persistent, with status asthmaticus   2. Perennial allergic rhinitis   3. Gastroesophageal reflux disease, esophagitis presence not specified     1.  Allergen avoidance measures  2.  Treat and prevent inflammation:   A.  Dulera 200 -2 inhalations twice a day with spacer  B.  OTC Nasacort -1 spray each nostril twice a day  3.  Treat and prevent reflux:   A.  Consolidate alcohol consumption  B.  Esomeprazole 40mg  tablet in a.m.  C.  Famotidine 40 mg tablet in p.m.  4.  If needed:   A.  Albuterol MDI 2 inhalations every 4-6 hours  5.  Prednisone 10 mg tablet daily   6.  Obtain Blood - CBC w/diff, IgE. Biological agent?  7.  Obtain Chest X-Ray  8.  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  Pending his response to this approach we will consider further evaluation and treatment including the administration of biological agents if he does not respond adequately to this approach.  Jiles Prows, MD Allergy / Immunology [facility] [place]

## 2017-12-16 ENCOUNTER — Telehealth: Payer: Self-pay | Admitting: *Deleted

## 2017-12-16 NOTE — Telephone Encounter (Signed)
Per Dr Neldon Mc called patient L/M for patient to call so we can advise chest xray at [facility] ok but consistent with asthma

## 2017-12-19 ENCOUNTER — Encounter: Payer: Self-pay | Admitting: Allergy and Immunology

## 2017-12-19 LAB — CBC WITH DIFFERENTIAL/PLATELET
BASOS: 0 %
Basophils Absolute: 0 10*3/uL (ref 0.0–0.2)
EOS (ABSOLUTE): 0 10*3/uL (ref 0.0–0.4)
EOS: 0 %
HEMATOCRIT: 40.1 % (ref 37.5–51.0)
HEMOGLOBIN: 13.5 g/dL (ref 13.0–17.7)
IMMATURE GRANS (ABS): 0.1 10*3/uL (ref 0.0–0.1)
Immature Granulocytes: 1 %
LYMPHS ABS: 1.2 10*3/uL (ref 0.7–3.1)
LYMPHS: 10 %
MCH: 33.6 pg — AB (ref 26.6–33.0)
MCHC: 33.7 g/dL (ref 31.5–35.7)
MCV: 100 fL — ABNORMAL HIGH (ref 79–97)
MONOCYTES: 7 %
Monocytes Absolute: 0.9 10*3/uL (ref 0.1–0.9)
NEUTROS ABS: 10.4 10*3/uL — AB (ref 1.4–7.0)
NEUTROS PCT: 82 %
Platelets: 317 10*3/uL (ref 150–450)
RBC: 4.02 x10E6/uL — ABNORMAL LOW (ref 4.14–5.80)
RDW: 12.5 % (ref 12.3–15.4)
WBC: 12.7 10*3/uL — AB (ref 3.4–10.8)

## 2017-12-19 LAB — IGE: IGE (IMMUNOGLOBULIN E), SERUM: 31 [IU]/mL (ref 6–495)

## 2017-12-19 NOTE — Telephone Encounter (Signed)
PT INFORMED

## 2017-12-20 ENCOUNTER — Encounter: Payer: Self-pay | Admitting: *Deleted

## 2017-12-27 DIAGNOSIS — L603 Nail dystrophy: Secondary | ICD-10-CM | POA: Diagnosis not present

## 2017-12-27 DIAGNOSIS — L570 Actinic keratosis: Secondary | ICD-10-CM | POA: Diagnosis not present

## 2017-12-27 DIAGNOSIS — Z85828 Personal history of other malignant neoplasm of skin: Secondary | ICD-10-CM | POA: Diagnosis not present

## 2017-12-27 DIAGNOSIS — L821 Other seborrheic keratosis: Secondary | ICD-10-CM | POA: Diagnosis not present

## 2017-12-29 ENCOUNTER — Encounter: Payer: Self-pay | Admitting: Allergy and Immunology

## 2017-12-29 ENCOUNTER — Ambulatory Visit (INDEPENDENT_AMBULATORY_CARE_PROVIDER_SITE_OTHER): Payer: Medicare Other | Admitting: Allergy and Immunology

## 2017-12-29 VITALS — HR 60 | Resp 16

## 2017-12-29 DIAGNOSIS — J3089 Other allergic rhinitis: Secondary | ICD-10-CM

## 2017-12-29 DIAGNOSIS — J4552 Severe persistent asthma with status asthmaticus: Secondary | ICD-10-CM | POA: Diagnosis not present

## 2017-12-29 DIAGNOSIS — H18832 Recurrent erosion of cornea, left eye: Secondary | ICD-10-CM | POA: Diagnosis not present

## 2017-12-29 DIAGNOSIS — H04123 Dry eye syndrome of bilateral lacrimal glands: Secondary | ICD-10-CM | POA: Diagnosis not present

## 2017-12-29 DIAGNOSIS — K219 Gastro-esophageal reflux disease without esophagitis: Secondary | ICD-10-CM

## 2017-12-29 DIAGNOSIS — H1859 Other hereditary corneal dystrophies: Secondary | ICD-10-CM | POA: Diagnosis not present

## 2017-12-29 MED ORDER — AZITHROMYCIN 250 MG PO TABS: ORAL_TABLET | ORAL | 0 refills | Status: DC

## 2017-12-29 NOTE — Patient Instructions (Addendum)
  1.  Allergen avoidance measures - DUST MITE  2.  Continue to Treat and prevent inflammation:   A.  Dulera 200 -2 inhalations twice a day with spacer  B.  OTC Nasacort -1 spray each nostril twice a day  C. Azithromycin 250 mg tablet 3 times per week for 8 weeks  3.  Continue to Treat and prevent reflux:   A.  Consolidate alcohol consumption  B.  Esomeprazole 40mg  tablet in a.m.  C.  Famotidine 40 mg tablet in p.m.  4.  If needed:   A.  Albuterol MDI 2 inhalations every 4-6 hours  5.  Prednisone 10 mg tablet daily for 4 weeks, then 10mg  alternating with 5mg  every other day for 4 weeks, then 5mg  daily until return to clinic  6. Return to clinic in 8 weeks

## 2017-12-29 NOTE — Progress Notes (Signed)
Follow-up Note  Referring Provider: Josetta Huddle, MD Primary Provider: Josetta Huddle, MD Date of Office Visit: 12/29/2017  Subjective:   [name] (DOB: Mar 20, 1932) is a 84 y.o. male who returns to the [facility] [place] on 12/29/2017 in re-evaluation of the following:  HPI: [name] returns to this clinic in reevaluation of his asthma and allergic rhinitis and reflux addressed during his initial evaluation of 15 December 2017.  He may be somewhat better but he still has a requirement for albuterol that is multiple times a week for wheezing that appears to occur on forced expiration even in the face of consistently using his Dulera.  2 days after visiting with me he developed sore throat and "Gumina plugs" and he self administered a course of azithromycin which helped him within 48 hours.  He is no longer using any Afrin as Nasacort appears to be resulting in very good control of his nasal congestion.  He is not having any issues with reflux and his throat clearing is better.  He does not need to cough as much because of the coating in his throat.  He continues to use a combination of a proton pump inhibitor and H2 receptor blocker.  He still drinks a few glasses of wine every night.  He has not performed any house dust avoidance measures inside the bedroom.  Allergies as of 12/29/2017      Reactions   Lisinopril Cough   Sulfa Antibiotics Hives      Medication List      albuterol 108 (90 Base) MCG/ACT inhaler Commonly known as:  PROVENTIL HFA;VENTOLIN HFA Inhale 2 puffs into the lungs every 6 (six) hours as needed. For shortness of breath.   aspirin EC 81 MG tablet Take 81 mg by mouth daily.   atorvastatin 40 MG tablet Commonly known as:  LIPITOR Take 40 mg by mouth daily.   esomeprazole 40 MG capsule Commonly known as:  NEXIUM Take 1 capsule by mouth every a.m   famotidine 40 MG tablet Commonly known as:  PEPCID Take 1 tablet by mouth every evening   metFORMIN 500 MG tablet Commonly known as:  GLUCOPHAGE Take 500-1,000 mg by mouth at bedtime. Take 500 mg every morning Take 1000 mg at bedtime   mometasone-formoterol 200-5 MCG/ACT Aero Commonly known as:  DULERA Inhale 2 puffs twice daily with spacer to prevent cough/wheeze. Rinse, gargle and spit after each use.   MYRBETRIQ 50 MG Tb24 tablet Generic drug:  mirabegron ER Take 1 tablet by mouth daily.   predniSONE 20 MG tablet Commonly known as:  DELTASONE       Past Medical History:  Diagnosis Date  . Asthma   . Cancer [facility])    prostate with seeds  . Coronary artery disease   . Diabetes mellitus   . High cholesterol     Past Surgical History:  Procedure Laterality Date  . CATARACT EXTRACTION, BILATERAL  1998, 2002  . CORONARY ARTERY BYPASS GRAFT    . HERNIA REPAIR    . INSERTION PROSTATE RADIATION SEED    . LEG SURGERY      Review of systems negative except as noted in HPI / PMHx or noted below:  Review of Systems  Constitutional: Negative.   HENT: Negative.   Eyes: Negative.   Respiratory: Negative.   Cardiovascular: Negative.   Gastrointestinal: Negative.   Genitourinary: Negative.   Musculoskeletal: Negative.   Skin: Negative.   Neurological: Negative.   Endo/Heme/Allergies: Negative.  Psychiatric/Behavioral: Negative.      Objective:   Vitals:   12/29/17 1014  Pulse: 60  Resp: 16          Physical Exam  Constitutional:  Slightly raspy voice  HENT:  Head: Normocephalic.  Right Ear: Tympanic membrane, external ear and ear canal normal.  Left Ear: Tympanic membrane, external ear and ear canal normal.  Nose: Nose normal. No mucosal edema or rhinorrhea.  Mouth/Throat: Uvula is midline, oropharynx is clear and moist and mucous membranes are normal. No oropharyngeal exudate.  Eyes: Conjunctivae are normal.  Neck: Trachea normal. No tracheal tenderness present. No tracheal deviation present. No thyromegaly present.  Cardiovascular: Normal  rate, regular rhythm, S1 normal, S2 normal and normal heart sounds.  No murmur heard. Pulmonary/Chest: Breath sounds normal. No stridor. No respiratory distress. He has no wheezes. He has no rales.  Musculoskeletal: He exhibits no edema.  Lymphadenopathy:       Head (right side): No tonsillar adenopathy present.       Head (left side): No tonsillar adenopathy present.    He has no cervical adenopathy.  Neurological: He is alert.  Skin: No rash noted. He is not diaphoretic. No erythema. Nails show no clubbing.    Diagnostics:    Spirometry was performed and demonstrated an FEV1 of 2.47 at 93 % of predicted.  The patient had an Asthma Control Test with the following results: ACT Total Score: 14.    Results of the chest x-ray obtained 15 December 2017 identified no significant abnormality.  Results of blood tests obtained 15 December 2017 identified WBC 12.7, eosinophils 0, lymphocyte 1200, hemoglobin 13.5, platelet 317  Assessment and Plan:   1. Steroid-dependent asthma, severe persistent, with status asthmaticus   2. Perennial allergic rhinitis   3. Gastroesophageal reflux disease, esophagitis presence not specified     1.  Allergen avoidance measures - DUST MITE  2.  Continue to Treat and prevent inflammation:   A.  Dulera 200 -2 inhalations twice a day with spacer  B.  OTC Nasacort -1 spray each nostril twice a day  C. Azithromycin 250 mg tablet 3 times per week for 8 weeks  3.  Continue to Treat and prevent reflux:   A.  Consolidate alcohol consumption  B.  Esomeprazole 40mg  tablet in a.m.  C.  Famotidine 40 mg tablet in p.m.  4.  If needed:   A.  Albuterol MDI 2 inhalations every 4-6 hours  5.  Prednisone 10 mg tablet daily for 4 weeks, then 10mg  alternating with 5mg  every other day for 4 weeks, then 5mg  daily until return to clinic  6. Return to clinic in 8 weeks  Quamere is somewhat better yet he still continues to have intermittent respiratory tract symptoms  consistent with inflammation of his airway.  I am going to treat him with azithromycin 3 times a week assuming that he may have a problem with mucus production stuck in his lungs while he also continues to utilize therapy directed against inflammation and reflux.  I have encouraged him to perform dust mite avoidance measures in the bedroom as he is definitely allergic to dust mites.  As well, we will begin to taper some of his steroid as noted above.  I will regroup with him in 8 weeks and then there will probably be an opportunity to further consolidate his treatment.  Allena Katz, MD Allergy / Immunology [place]

## 2018-01-02 ENCOUNTER — Encounter: Payer: Self-pay | Admitting: Allergy and Immunology

## 2018-01-03 ENCOUNTER — Ambulatory Visit: Payer: Medicare Other | Admitting: Allergy and Immunology

## 2018-01-05 DIAGNOSIS — I2510 Atherosclerotic heart disease of native coronary artery without angina pectoris: Secondary | ICD-10-CM | POA: Diagnosis not present

## 2018-01-05 DIAGNOSIS — E782 Mixed hyperlipidemia: Secondary | ICD-10-CM | POA: Diagnosis not present

## 2018-01-05 DIAGNOSIS — E7800 Pure hypercholesterolemia, unspecified: Secondary | ICD-10-CM | POA: Diagnosis not present

## 2018-01-05 DIAGNOSIS — J4520 Mild intermittent asthma, uncomplicated: Secondary | ICD-10-CM | POA: Diagnosis not present

## 2018-01-05 DIAGNOSIS — R350 Frequency of micturition: Secondary | ICD-10-CM | POA: Diagnosis not present

## 2018-01-05 DIAGNOSIS — E559 Vitamin D deficiency, unspecified: Secondary | ICD-10-CM | POA: Diagnosis not present

## 2018-01-05 DIAGNOSIS — J45901 Unspecified asthma with (acute) exacerbation: Secondary | ICD-10-CM | POA: Diagnosis not present

## 2018-02-13 ENCOUNTER — Other Ambulatory Visit: Payer: Self-pay | Admitting: *Deleted

## 2018-02-13 MED ORDER — FAMOTIDINE 20 MG PO TABS: ORAL_TABLET | ORAL | 5 refills | Status: AC

## 2018-02-21 ENCOUNTER — Ambulatory Visit (INDEPENDENT_AMBULATORY_CARE_PROVIDER_SITE_OTHER): Payer: Medicare Other | Admitting: Allergy and Immunology

## 2018-02-21 ENCOUNTER — Encounter: Payer: Self-pay | Admitting: Allergy and Immunology

## 2018-02-21 VITALS — BP 126/72 | HR 58 | Resp 18

## 2018-02-21 DIAGNOSIS — J4552 Severe persistent asthma with status asthmaticus: Secondary | ICD-10-CM

## 2018-02-21 DIAGNOSIS — J3089 Other allergic rhinitis: Secondary | ICD-10-CM

## 2018-02-21 DIAGNOSIS — K219 Gastro-esophageal reflux disease without esophagitis: Secondary | ICD-10-CM | POA: Diagnosis not present

## 2018-02-21 NOTE — Patient Instructions (Addendum)
  1.  Allergen avoidance measures - DUST MITE  2.  Continue to Treat and prevent inflammation:   A.  Dulera 200 -2 inhalations in AM with spacer  B.  Flovent 110 - 2 inhalations in PM with spacer  C.  OTC Nasacort -1 spray each nostril daily  3.  Continue to Treat and prevent reflux:   A.  Esomeprazole 40mg  tablet in a.m.  B.  Famotidine 40 mg tablet in p.m.  4.  If needed:   A.  Albuterol MDI 2 inhalations every 4-6 hours  5.  Prednisone 5mg  daily for 2 weeks, then 5mg  every other day for 2 weeks, then 2.5mg  every other day for 2 weeks, then discontinue  6. Return to clinic in 8 weeks - biological agent?

## 2018-02-21 NOTE — Progress Notes (Signed)
Follow-up Note  Referring Provider: Josetta Huddle, MD Primary Provider: Josetta Huddle, MD Date of Office Visit: 02/21/2018  Subjective:   [name] (DOB: 1932/08/28) is a 85 y.o. male who returns to the [facility] [place] on 02/21/2018 in re-evaluation of the following:  HPI: [name] returns to this clinic in evaluation of his steroid-dependent asthma, allergic rhinitis, and reflux.  His last visit to this clinic was 29 December 2017.  He has really done very well since his last visit.  He has not had to use a short acting bronchodilator other than less than a handful of times since his last visit.  He has resolved the "[name] plugs" emanating from his chest.  He has noticed that he has developed some problems while using Dulera.  He has had increased urinary voiding both during the daytime and nighttime and he has sleeplessness at nighttime.  He does not mind the issue of voiding during the daytime but at nighttime he must get up every 2 hours when using Dulera.  He continues on prednisone currently at 5 mg daily.  He discontinued his 3 times a week azithromycin after 8-week use.  He has had no issues with his upper airways and does not need to use any Nasacort at this point.   His throat issue has improved significantly and he has very little throat clearing or postnasal drip.  He has no issues with reflux.  He is not consuming any caffeine and drinks about 8 ounces of wine per night.  Allergies as of 02/21/2018      Reactions   Lisinopril Cough   Sulfa Antibiotics Hives      Medication List      albuterol 108 (90 Base) MCG/ACT inhaler Commonly known as:  PROVENTIL HFA;VENTOLIN HFA Inhale 2 puffs into the lungs every 6 (six) hours as needed. For shortness of breath.   aspirin EC 81 MG tablet Take 81 mg by mouth daily.   atorvastatin 40 MG tablet Commonly known as:  LIPITOR Take 40 mg by mouth daily.   CoQ10 400 MG Caps Take by mouth.   esomeprazole 40 MG  capsule Commonly known as:  NEXIUM Take 1 capsule by mouth every a.m   famotidine 20 MG tablet Commonly known as:  PEPCID Take 2 tablets every evening as directed   Magnesium 250 MG Tabs Take by mouth.   metFORMIN 500 MG tablet Commonly known as:  GLUCOPHAGE Take 500-1,000 mg by mouth at bedtime. Take 500 mg every morning Take 1000 mg at bedtime   mometasone-formoterol 200-5 MCG/ACT Aero Commonly known as:  DULERA Inhale 2 puffs twice daily with spacer to prevent cough/wheeze. Rinse, gargle and spit after each use.   predniSONE 20 MG tablet Commonly known as:  DELTASONE Take 5 mg by mouth daily with breakfast.   vitamin B-12 1000 MCG tablet Commonly known as:  CYANOCOBALAMIN Take 3,000 mcg by mouth daily.   VITAMIN D-1000 MAX ST PO Take 3,000 mg by mouth.       Past Medical History:  Diagnosis Date  . Asthma   . Cancer [facility])    prostate with seeds  . Coronary artery disease   . Diabetes mellitus   . High cholesterol     Past Surgical History:  Procedure Laterality Date  . CATARACT EXTRACTION, BILATERAL  1998, 2002  . CORONARY ARTERY BYPASS GRAFT    . HERNIA REPAIR    . INSERTION PROSTATE RADIATION SEED    .  LEG SURGERY      Review of systems negative except as noted in HPI / PMHx or noted below:  Review of Systems  Constitutional: Negative.   HENT: Negative.   Eyes: Negative.   Respiratory: Negative.   Cardiovascular: Negative.   Gastrointestinal: Negative.   Genitourinary: Negative.   Musculoskeletal: Negative.   Skin: Negative.   Neurological: Negative.   Endo/Heme/Allergies: Negative.   Psychiatric/Behavioral: Negative.      Objective:   Vitals:   02/21/18 0858  BP: 126/72  Pulse: (!) 58  Resp: 18  SpO2: 94%          Physical Exam Constitutional:      Appearance: He is not diaphoretic.  HENT:     Head: Normocephalic.     Right Ear: Tympanic membrane, ear canal and external ear normal.     Left Ear: Tympanic membrane, ear  canal and external ear normal.     Nose: Nose normal. No mucosal edema or rhinorrhea.     Mouth/Throat:     Pharynx: Uvula midline. No oropharyngeal exudate.  Eyes:     Conjunctiva/sclera: Conjunctivae normal.  Neck:     Thyroid: No thyromegaly.     Trachea: Trachea normal. No tracheal tenderness or tracheal deviation.  Cardiovascular:     Rate and Rhythm: Normal rate and regular rhythm.     Heart sounds: Normal heart sounds, S1 normal and S2 normal. No murmur.  Pulmonary:     Effort: No respiratory distress.     Breath sounds: Normal breath sounds. No stridor. No wheezing or rales.  Lymphadenopathy:     Head:     Right side of head: No tonsillar adenopathy.     Left side of head: No tonsillar adenopathy.     Cervical: No cervical adenopathy.  Skin:    Findings: No erythema or rash.     Nails: There is no clubbing.   Neurological:     Mental Status: He is alert.     Diagnostics:    Spirometry was performed and demonstrated an FEV1 of 2.30 at 88 % of predicted.  The patient had an Asthma Control Test with the following results: ACT Total Score: 25.    Assessment and Plan:   1. Steroid-dependent asthma, severe persistent, with status asthmaticus   2. Perennial allergic rhinitis   3. Gastroesophageal reflux disease, esophagitis presence not specified     1.  Allergen avoidance measures - DUST MITE  2.  Continue to Treat and prevent inflammation:   A.  Dulera 200 -2 inhalations in AM with spacer  B.  Flovent 110 - 2 inhalations in PM with spacer  C.  OTC Nasacort -1 spray each nostril daily  3.  Continue to Treat and prevent reflux:   A.  Esomeprazole 40mg  tablet in a.m.  B.  Famotidine 40 mg tablet in p.m.  4.  If needed:   A.  Albuterol MDI 2 inhalations every 4-6 hours  5.  Prednisone 5mg  daily for 2 weeks, then 5mg  every other day for 2 weeks, then 2.5mg  every other day for 2 weeks, then discontinue  6. Return to clinic in 8 weeks - biological  agent?  Law is certainly doing a lot better regarding all of his respiratory tract issues tied up with inflammation and reflux on his current plan.  We will continue to proceed with his steroid taper aiming for elimination of this agent over the course of the next 6 weeks.  I did have a talk  with him today about potential adrenal suppression during tapering and the need for stress steroids.  We will try to address the side effect he is developing from [facility] by having him only use Dulera in the morning and using a non-LABA containing anti-inflammatory agent at night.  I will see if he would qualify for a biological agent that we may need to initiate pending his response to the approach noted above during his 8-week reevaluation visit.  Allena Katz, MD Allergy / Immunology [place]

## 2018-02-22 ENCOUNTER — Encounter: Payer: Self-pay | Admitting: Allergy and Immunology

## 2018-02-28 DIAGNOSIS — Z85828 Personal history of other malignant neoplasm of skin: Secondary | ICD-10-CM | POA: Diagnosis not present

## 2018-02-28 DIAGNOSIS — L603 Nail dystrophy: Secondary | ICD-10-CM | POA: Diagnosis not present

## 2018-02-28 DIAGNOSIS — L570 Actinic keratosis: Secondary | ICD-10-CM | POA: Diagnosis not present

## 2018-02-28 DIAGNOSIS — L821 Other seborrheic keratosis: Secondary | ICD-10-CM | POA: Diagnosis not present

## 2018-03-02 ENCOUNTER — Ambulatory Visit: Payer: Medicare Other | Admitting: Allergy and Immunology

## 2018-03-07 DIAGNOSIS — Z7984 Long term (current) use of oral hypoglycemic drugs: Secondary | ICD-10-CM | POA: Diagnosis not present

## 2018-03-07 DIAGNOSIS — E782 Mixed hyperlipidemia: Secondary | ICD-10-CM | POA: Diagnosis not present

## 2018-03-07 DIAGNOSIS — J4520 Mild intermittent asthma, uncomplicated: Secondary | ICD-10-CM | POA: Diagnosis not present

## 2018-03-07 DIAGNOSIS — Z8546 Personal history of malignant neoplasm of prostate: Secondary | ICD-10-CM | POA: Diagnosis not present

## 2018-03-07 DIAGNOSIS — I2510 Atherosclerotic heart disease of native coronary artery without angina pectoris: Secondary | ICD-10-CM | POA: Diagnosis not present

## 2018-03-07 DIAGNOSIS — E119 Type 2 diabetes mellitus without complications: Secondary | ICD-10-CM | POA: Diagnosis not present

## 2018-03-07 DIAGNOSIS — J45901 Unspecified asthma with (acute) exacerbation: Secondary | ICD-10-CM | POA: Diagnosis not present

## 2018-03-07 DIAGNOSIS — R350 Frequency of micturition: Secondary | ICD-10-CM | POA: Diagnosis not present

## 2018-04-13 DIAGNOSIS — R0600 Dyspnea, unspecified: Secondary | ICD-10-CM | POA: Diagnosis not present

## 2018-04-13 DIAGNOSIS — J45909 Unspecified asthma, uncomplicated: Secondary | ICD-10-CM | POA: Diagnosis not present

## 2018-04-18 ENCOUNTER — Encounter: Payer: Self-pay | Admitting: Allergy and Immunology

## 2018-04-18 ENCOUNTER — Ambulatory Visit (INDEPENDENT_AMBULATORY_CARE_PROVIDER_SITE_OTHER): Payer: Medicare Other | Admitting: Allergy and Immunology

## 2018-04-18 VITALS — BP 114/60 | HR 54 | Resp 18

## 2018-04-18 DIAGNOSIS — J3089 Other allergic rhinitis: Secondary | ICD-10-CM

## 2018-04-18 DIAGNOSIS — K219 Gastro-esophageal reflux disease without esophagitis: Secondary | ICD-10-CM

## 2018-04-18 DIAGNOSIS — J4550 Severe persistent asthma, uncomplicated: Secondary | ICD-10-CM | POA: Diagnosis not present

## 2018-04-18 NOTE — Progress Notes (Signed)
[place]   Follow-up Note  Referring Provider: Josetta Huddle, MD Primary Provider: Josetta Huddle, MD Date of Office Visit: 04/18/2018  Subjective:   [name] (DOB: 1933/02/04) is a 85 y.o. male who returns to the [facility] and [place] on 04/18/2018 in re-evaluation of the following:  HPI: [name] returns to this clinic in evaluation of his steroid-dependent asthma, allergic rhinitis, and reflux.  His last visit to this clinic was 21 February 2018.  He has tapered off all of his systemic steroids 3 days ago and still continues to do well.  He has determined that [facility] is not responsible for his increased urinary voiding at nighttime.  He went several days without Dulera to see if this was the case and indeed this did not appear to affect his voiding.  However, when he was without [facility] for several days he did have significant issues with wheezing and coughing and shortness of breath.  On Dulera twice a day he does very well.  His reflux is under excellent control at this point in time.  He has not had any issues with his nose.  Allergies as of 04/18/2018      Reactions   Lisinopril Cough   Sulfa Antibiotics Hives      Medication List      albuterol 108 (90 Base) MCG/ACT inhaler Commonly known as:  PROVENTIL HFA;VENTOLIN HFA Inhale 2 puffs into the lungs every 6 (six) hours as needed. For shortness of breath.   aspirin EC 81 MG tablet Take 81 mg by mouth daily.   atorvastatin 40 MG tablet Commonly known as:  LIPITOR Take 40 mg by mouth daily.   CoQ10 400 MG Caps Take by mouth.   esomeprazole 40 MG capsule Commonly known as:  NEXIUM Take 1 capsule by mouth every a.m   famotidine 20 MG tablet Commonly known as:  PEPCID Take 2 tablets every evening as directed   Magnesium 250 MG Tabs Take by mouth.   metFORMIN 500 MG tablet Commonly known as:  GLUCOPHAGE Take 500-1,000 mg by mouth at bedtime. Take 500 mg  every morning Take 1000 mg at bedtime   mometasone-formoterol 200-5 MCG/ACT Aero Commonly known as:  DULERA Inhale 2 puffs twice daily with spacer to prevent cough/wheeze. Rinse, gargle and spit after each use.   predniSONE 20 MG tablet Commonly known as:  DELTASONE Take 5 mg by mouth daily with breakfast.   vitamin B-12 1000 MCG tablet Commonly known as:  CYANOCOBALAMIN Take 3,000 mcg by mouth daily.   VITAMIN D-1000 MAX ST PO Take 3,000 mg by mouth.       Past Medical History:  Diagnosis Date  . Asthma   . Cancer [facility])    prostate with seeds  . Coronary artery disease   . Diabetes mellitus   . High cholesterol     Past Surgical History:  Procedure Laterality Date  . CATARACT EXTRACTION, BILATERAL  1998, 2002  . CORONARY ARTERY BYPASS GRAFT    . HERNIA REPAIR    . INSERTION PROSTATE RADIATION SEED    . LEG SURGERY      Review of systems negative except as noted in HPI / PMHx or noted below:  Review of Systems  Constitutional: Negative.   HENT: Negative.   Eyes: Negative.   Respiratory: Negative.   Cardiovascular: Negative.   Gastrointestinal: Negative.   Genitourinary: Negative.   Musculoskeletal: Negative.   Skin: Negative.   Neurological:  Negative.   Endo/Heme/Allergies: Negative.   Psychiatric/Behavioral: Negative.      Objective:   Vitals:   04/18/18 1452  BP: 114/60  Pulse: (!) 54  Resp: 18  SpO2: 96%          Physical Exam Constitutional:      Appearance: He is not diaphoretic.  HENT:     Head: Normocephalic.     Right Ear: Tympanic membrane, ear canal and external ear normal.     Left Ear: Tympanic membrane, ear canal and external ear normal.     Nose: Nose normal. No mucosal edema or rhinorrhea.     Mouth/Throat:     Pharynx: Uvula midline. No oropharyngeal exudate.  Eyes:     Conjunctiva/sclera: Conjunctivae normal.  Neck:     Thyroid: No thyromegaly.     Trachea: Trachea normal. No tracheal tenderness or tracheal  deviation.  Cardiovascular:     Rate and Rhythm: Normal rate and regular rhythm.     Heart sounds: Normal heart sounds, S1 normal and S2 normal. No murmur.  Pulmonary:     Effort: No respiratory distress.     Breath sounds: Normal breath sounds. No stridor. No wheezing or rales.  Lymphadenopathy:     Head:     Right side of head: No tonsillar adenopathy.     Left side of head: No tonsillar adenopathy.     Cervical: No cervical adenopathy.  Skin:    Findings: No erythema or rash.     Nails: There is no clubbing.   Neurological:     Mental Status: He is alert.     Diagnostics:    Spirometry was performed and demonstrated an FEV1 of 2.30 at 92 % of predicted.  The patient had an Asthma Control Test with the following results: ACT Total Score: 19.    Assessment and Plan:   1. Steroid-dependent asthma, severe persistent, uncomplicated   2. Perennial allergic rhinitis   3. Gastroesophageal reflux disease, esophagitis presence not specified     1.  Allergen avoidance measures - DUST MITE  2.  Continue to Treat and prevent inflammation:   A.  Dulera 200 -2 inhalations in 2 times a day with spacer  B.  OTC Nasacort -1 spray each nostril daily  C.  Add Flovent 110 2 inhalations twice a day during "flareup"  3.  Continue to Treat and prevent reflux:   A.  Esomeprazole 40mg  tablet in a.m.  B.  Famotidine 40 mg tablet in p.m.  4.  If needed:   A.  Albuterol MDI 2 inhalations every 4-6 hours  5.  Return to clinic in 12 weeks or earlier if problem  [name] appears to be doing very well on his current medical plan and we will continue to have him use anti-inflammatory agents for his airway and therapy directed against reflux.  He just discontinued oral systemic steroid use with a very slow taper over the past several months and hopefully he will continue to do well the further he gets away from the use of that agent.  I will see him back in this clinic in 12 weeks or earlier if  there is a problem.   Bradley Katz, MD Allergy / Immunology [place]

## 2018-04-18 NOTE — Patient Instructions (Addendum)
  1.  Allergen avoidance measures - DUST MITE  2.  Continue to Treat and prevent inflammation:   A.  Dulera 200 -2 inhalations in 2 times a day with spacer  B.  OTC Nasacort -1 spray each nostril daily  C.  Add Flovent 110 2 inhalations twice a day during "flareup"  3.  Continue to Treat and prevent reflux:   A.  Esomeprazole 40mg  tablet in a.m.  B.  Famotidine 40 mg tablet in p.m.  4.  If needed:   A.  Albuterol MDI 2 inhalations every 4-6 hours  5.  Return to clinic in 12 weeks or earlier if problem

## 2018-04-19 ENCOUNTER — Encounter: Payer: Self-pay | Admitting: Allergy and Immunology

## 2018-06-15 ENCOUNTER — Other Ambulatory Visit: Payer: Self-pay | Admitting: Allergy and Immunology

## 2018-07-18 ENCOUNTER — Ambulatory Visit: Payer: Medicare Other | Admitting: Allergy and Immunology

## 2018-07-19 ENCOUNTER — Other Ambulatory Visit: Payer: Self-pay

## 2018-07-19 ENCOUNTER — Encounter: Payer: Self-pay | Admitting: Allergy and Immunology

## 2018-07-19 ENCOUNTER — Ambulatory Visit (INDEPENDENT_AMBULATORY_CARE_PROVIDER_SITE_OTHER): Payer: Medicare Other | Admitting: Allergy and Immunology

## 2018-07-19 DIAGNOSIS — J3089 Other allergic rhinitis: Secondary | ICD-10-CM

## 2018-07-19 DIAGNOSIS — K219 Gastro-esophageal reflux disease without esophagitis: Secondary | ICD-10-CM | POA: Diagnosis not present

## 2018-07-19 DIAGNOSIS — J4550 Severe persistent asthma, uncomplicated: Secondary | ICD-10-CM | POA: Diagnosis not present

## 2018-07-19 MED ORDER — MOMETASONE FURO-FORMOTEROL FUM 200-5 MCG/ACT IN AERO: INHALATION_SPRAY | RESPIRATORY_TRACT | 5 refills | Status: DC

## 2018-07-19 NOTE — Progress Notes (Signed)
[place]   Follow-up Note  Referring Provider: Josetta Huddle, MD Primary Provider: Josetta Huddle, MD Date of Office Visit: 07/19/2018  Subjective:   [name] (DOB: Oct 11, 1932) is a 85 y.o. male who returns to the [place] on 07/19/2018 in re-evaluation of the following:  HPI: This is a E-med visit requested by patient who is located at home.  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  His last visit to this clinic was 18 April 2018.  At that point in time he was doing very well regarding his airway while tapering off a very prolonged course of systemic steroids.  Has used prednisone twice at 20mg  for 1-3 days for 'bronchospasm and wheezing'. Last use 21 Jun 2018. Has increased Dulera to 3 times per day which he believes works better than 2 times per day without any side effects. Has not added Flovent. Used SABA when 'flared' around time of prednisone use.  Reflux OK with PPI and H2RB. Very careful about caffeine consumption with 1 cup of coffee per morning.   Being very careful about coronavirus exposure and performing self-isolation.  Allergies as of 07/19/2018      Reactions   Lisinopril Cough   Sulfa Antibiotics Hives      Medication List      albuterol 108 (90 Base) MCG/ACT inhaler Commonly known as:  VENTOLIN HFA Inhale 2 puffs into the lungs every 6 (six) hours as needed. For shortness of breath.   aspirin EC 81 MG tablet Take 81 mg by mouth daily.   atorvastatin 40 MG tablet Commonly known as:  LIPITOR Take 40 mg by mouth daily.   CoQ10 400 MG Caps Take by mouth.   esomeprazole 40 MG capsule Commonly known as:  NEXIUM TAKE 1 CAPSULE BY MOUTH EVERY DAY IN THE MORNING   famotidine 20 MG tablet Commonly known as:  PEPCID Take 2 tablets every evening as directed   fexofenadine 180 MG tablet Commonly known as:  ALLEGRA  Take 180 mg by mouth. Takes 2 tablets in the morning, 1 tablet 6 hours later, then 2 tablets 8 hours later.   Flovent HFA 110 MCG/ACT inhaler Generic drug:  fluticasone   Magnesium 250 MG Tabs Take by mouth.   metFORMIN 500 MG tablet Commonly known as:  GLUCOPHAGE Take 500-1,000 mg by mouth at bedtime. Take 500 mg every morning Take 1000 mg at bedtime   mometasone-formoterol 200-5 MCG/ACT Aero Commonly known as:  Dulera Inhale 2 puffs twice daily with spacer to prevent cough/wheeze. Rinse, gargle and spit after each use.   vitamin B-12 1000 MCG tablet Commonly known as:  CYANOCOBALAMIN Take 3,000 mcg by mouth daily.   VITAMIN D-1000 MAX ST PO Take 3,000 mg by mouth.       Past Medical History:  Diagnosis Date  . Asthma   . Cancer [facility])    prostate with seeds  . Coronary artery disease   . Diabetes mellitus   . High cholesterol     Past Surgical History:  Procedure Laterality Date  . CATARACT EXTRACTION, BILATERAL  1998, 2002  . CORONARY ARTERY BYPASS GRAFT    . HERNIA REPAIR    . INSERTION PROSTATE RADIATION SEED    . LEG SURGERY      Review of systems negative except as noted in HPI / PMHx or noted below:  Review of Systems  Constitutional: Negative.   HENT: Negative.   Eyes: Negative.   Respiratory: Negative.   Cardiovascular: Negative.   Gastrointestinal: Negative.   Genitourinary: Negative.   Musculoskeletal: Negative.   Skin: Negative.   Neurological: Negative.   Endo/Heme/Allergies: Negative.   Psychiatric/Behavioral: Negative.      Objective:   There were no vitals filed for this visit.        Physical Exam-deferred  Diagnostics: none  Assessment and Plan:   1. Not well controlled severe persistent asthma   2. Perennial allergic rhinitis   3. Gastroesophageal reflux disease, esophagitis presence not specified     1.  Allergen avoidance measures - DUST MITE  2.  Continue to Treat and prevent inflammation:   A.  Dulera 200 -2  inhalations in 2 times a day with spacer  B.  OTC Nasacort -1 spray each nostril daily  C.  CONSISTENTLY USE Flovent 110 - 2 inhalations twice a day    3.  Continue to Treat and prevent reflux:   A.  Esomeprazole 40mg  tablet in a.m.  B.  Famotidine 40 mg tablet in p.m.  4.  If needed:   A.  Albuterol MDI 2 inhalations every 4-6 hours  B. OTC antihistamine  5.  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 And he is using Dulera 3 times a day. I suggested to him that he use a combination of Dulera and Flovent 2 times per day rather than Dulera 3 times per day. He will continue on therapy for his reflux as well. I will see him back in this clinic in 12 weeks or earlier if needed.  Total patient interaction time 20 minutes  Allena Katz, MD Allergy / [place]

## 2018-07-19 NOTE — Patient Instructions (Addendum)
  1.  Allergen avoidance measures - DUST MITE  2.  Continue to Treat and prevent inflammation:   A.  Dulera 200 -2 inhalations in 2 times a day with spacer  B.  OTC Nasacort -1 spray each nostril daily  C.  CONSISTENTLY USE Flovent 110 - 2 inhalations twice a day    3.  Continue to Treat and prevent reflux:   A.  Esomeprazole 40mg  tablet in a.m.  B.  Famotidine 40 mg tablet in p.m.  4.  If needed:   A.  Albuterol MDI 2 inhalations every 4-6 hours  B. OTC antihistamine  5.  Return to clinic in 12 weeks or earlier if problem

## 2018-07-20 ENCOUNTER — Encounter: Payer: Self-pay | Admitting: Allergy and Immunology

## 2018-07-21 NOTE — Progress Notes (Signed)
Patient is at home. Provider is in office. Start Time: 853 am End Time: 924 am

## 2018-07-27 DIAGNOSIS — R0600 Dyspnea, unspecified: Secondary | ICD-10-CM | POA: Diagnosis not present

## 2018-07-27 DIAGNOSIS — J45909 Unspecified asthma, uncomplicated: Secondary | ICD-10-CM | POA: Diagnosis not present

## 2018-07-28 DIAGNOSIS — J042 Acute laryngotracheitis: Secondary | ICD-10-CM | POA: Diagnosis not present

## 2018-07-28 DIAGNOSIS — J45901 Unspecified asthma with (acute) exacerbation: Secondary | ICD-10-CM | POA: Diagnosis not present

## 2018-08-01 DIAGNOSIS — H04123 Dry eye syndrome of bilateral lacrimal glands: Secondary | ICD-10-CM | POA: Diagnosis not present

## 2018-08-01 DIAGNOSIS — H34212 Partial retinal artery occlusion, left eye: Secondary | ICD-10-CM | POA: Diagnosis not present

## 2018-08-01 DIAGNOSIS — H18832 Recurrent erosion of cornea, left eye: Secondary | ICD-10-CM | POA: Diagnosis not present

## 2018-08-01 DIAGNOSIS — E119 Type 2 diabetes mellitus without complications: Secondary | ICD-10-CM | POA: Diagnosis not present

## 2018-08-02 ENCOUNTER — Other Ambulatory Visit: Payer: Self-pay | Admitting: Internal Medicine

## 2018-08-02 ENCOUNTER — Other Ambulatory Visit: Payer: PRIVATE HEALTH INSURANCE

## 2018-08-02 DIAGNOSIS — H3509 Other intraretinal microvascular abnormalities: Secondary | ICD-10-CM

## 2018-08-04 ENCOUNTER — Other Ambulatory Visit: Payer: Self-pay

## 2018-08-04 ENCOUNTER — Ambulatory Visit (HOSPITAL_COMMUNITY)
Admission: RE | Admit: 2018-08-04 | Discharge: 2018-08-04 | Disposition: A | Discharge status: Home / Self Care | Payer: Medicare Other | Source: Ambulatory Visit | Attending: Ophthalmology | Admitting: Ophthalmology

## 2018-08-04 DIAGNOSIS — H3509 Other intraretinal microvascular abnormalities: Secondary | ICD-10-CM | POA: Diagnosis not present

## 2018-08-14 ENCOUNTER — Other Ambulatory Visit: Payer: PRIVATE HEALTH INSURANCE

## 2018-09-12 DIAGNOSIS — H34212 Partial retinal artery occlusion, left eye: Secondary | ICD-10-CM | POA: Diagnosis not present

## 2018-09-12 DIAGNOSIS — H04123 Dry eye syndrome of bilateral lacrimal glands: Secondary | ICD-10-CM | POA: Diagnosis not present

## 2018-09-12 DIAGNOSIS — H18832 Recurrent erosion of cornea, left eye: Secondary | ICD-10-CM | POA: Diagnosis not present

## 2018-09-12 DIAGNOSIS — E119 Type 2 diabetes mellitus without complications: Secondary | ICD-10-CM | POA: Diagnosis not present

## 2018-10-03 DIAGNOSIS — R0681 Apnea, not elsewhere classified: Secondary | ICD-10-CM | POA: Diagnosis not present

## 2018-10-10 ENCOUNTER — Other Ambulatory Visit: Payer: Self-pay

## 2018-10-10 ENCOUNTER — Encounter: Payer: Self-pay | Admitting: Allergy and Immunology

## 2018-10-10 ENCOUNTER — Ambulatory Visit (INDEPENDENT_AMBULATORY_CARE_PROVIDER_SITE_OTHER): Payer: Medicare Other | Admitting: Allergy and Immunology

## 2018-10-10 VITALS — BP 142/70 | HR 54 | Temp 97.3°F | Resp 16 | Ht 68.0 in | Wt 163.8 lb

## 2018-10-10 DIAGNOSIS — K219 Gastro-esophageal reflux disease without esophagitis: Secondary | ICD-10-CM | POA: Diagnosis not present

## 2018-10-10 DIAGNOSIS — J3089 Other allergic rhinitis: Secondary | ICD-10-CM | POA: Diagnosis not present

## 2018-10-10 DIAGNOSIS — J4550 Severe persistent asthma, uncomplicated: Secondary | ICD-10-CM | POA: Diagnosis not present

## 2018-10-10 NOTE — Progress Notes (Signed)
[place]   Follow-up Note  Referring Provider: Josetta Huddle, MD Primary Provider: Josetta Huddle, MD Date of Office Visit: 10/10/2018  Subjective:   [name] (DOB: [birth year]) is a 85 y.o. 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  His last visit to this clinic was a E - med visit on 19 July 2018.  During our last encounter I recommended that he use a combination of Dulera and Flovent on a consistent basis given his previous history of steroid-dependent asthma.  Unfortunately, he only used his Dulera and did not use Flovent and subsequently ended back up on prednisone.  He reinitiated prednisone on 24 August 2018 at 10 mg daily and has been slowly tapering and has discontinued the use of prednisone stepping off from 2.5 mg to 0 on 03 October 2018.  At this point he has no problems with his lungs.  He does not wheeze and cough and does not use a short acting bronchodilator and can exercise without a problem.  He continues to use Afrin at nighttime.  He uses Nasacort in the morning.  Currently his nose feels pretty good and he can smell and taste without any problem.  He is having a sleep study performed next week in investigation of fractured sleep time.  Allergies as of 10/10/2018      Reactions   Lisinopril Cough   Sulfa Antibiotics Hives      Medication List      albuterol 108 (90 Base) MCG/ACT inhaler Commonly known as: VENTOLIN HFA Inhale 2 puffs into the lungs every 6 (six) hours as needed. For shortness of breath.   aspirin EC 81 MG tablet Take 81 mg by mouth daily.   atorvastatin 40 MG tablet Commonly known as: LIPITOR Take 40 mg by mouth daily.   CoQ10 400 MG Caps Take by mouth.   esomeprazole 40 MG capsule Commonly known  as: NEXIUM TAKE 1 CAPSULE BY MOUTH EVERY DAY IN THE MORNING   famotidine 20 MG tablet Commonly known as: PEPCID Take 2 tablets every evening as directed   fexofenadine 180 MG tablet Commonly known as: ALLEGRA Take 180 mg by mouth. Takes 2 tablets in the morning, 1 tablet 6 hours later, then 2 tablets 8 hours later.   Flovent HFA 110 MCG/ACT inhaler Generic drug: fluticasone   Magnesium 250 MG Tabs Take by mouth.   metFORMIN 500 MG tablet Commonly known as: GLUCOPHAGE Take 500-1,000 mg by mouth at bedtime. Take 500 mg every morning Take 1000 mg at bedtime   mometasone-formoterol 200-5 MCG/ACT Aero Commonly known as: Dulera Inhale 2 puffs twice daily with spacer to prevent cough/wheeze. Rinse, gargle and spit after each use.   vitamin B-12 1000 MCG tablet Commonly known as: CYANOCOBALAMIN Take 3,000 mcg by mouth daily.   VITAMIN D-1000 MAX ST PO Take 3,000 mg by mouth.       Past Medical History:  Diagnosis Date  . Asthma   . Cancer [facility])    prostate with seeds  . Coronary artery disease   . Diabetes mellitus   . High cholesterol     Past Surgical History:  Procedure Laterality Date  . CATARACT EXTRACTION, BILATERAL  1998, 2002  . CORONARY ARTERY BYPASS GRAFT    . HERNIA REPAIR    .  INSERTION PROSTATE RADIATION SEED    . LEG SURGERY      Review of systems negative except as noted in HPI / PMHx or noted below:  Review of Systems  Constitutional: Negative.   HENT: Negative.   Eyes: Negative.   Respiratory: Negative.   Cardiovascular: Negative.   Gastrointestinal: Negative.   Genitourinary: Negative.   Musculoskeletal: Negative.   Skin: Negative.   Neurological: Negative.   Endo/Heme/Allergies: Negative.   Psychiatric/Behavioral: Negative.      Objective:   Vitals:   10/10/18 1103  BP: (!) 142/70  Pulse: (!) 54  Resp: 16  Temp: (!) 97.3 F (36.3 C)  SpO2: 99%   Height: 5\' 8"  (172.7 cm)  Weight: 163 lb 12.8 oz (74.3 kg)   Physical Exam  Constitutional:      Appearance: He is not diaphoretic.  HENT:     Head: Normocephalic.     Right Ear: Tympanic membrane, ear canal and external ear normal.     Left Ear: Tympanic membrane, ear canal and external ear normal.     Nose: Nose normal. No mucosal edema or rhinorrhea.     Mouth/Throat:     Pharynx: Uvula midline. No oropharyngeal exudate.  Eyes:     Conjunctiva/sclera: Conjunctivae normal.  Neck:     Thyroid: No thyromegaly.     Trachea: Trachea normal. No tracheal tenderness or tracheal deviation.  Cardiovascular:     Rate and Rhythm: Normal rate and regular rhythm.     Heart sounds: Normal heart sounds, S1 normal and S2 normal. No murmur.  Pulmonary:     Effort: No respiratory distress.     Breath sounds: Normal breath sounds. No stridor. No wheezing or rales.  Lymphadenopathy:     Head:     Right side of head: No tonsillar adenopathy.     Left side of head: No tonsillar adenopathy.     Cervical: No cervical adenopathy.  Skin:    Findings: No erythema or rash.     Nails: There is no clubbing.   Neurological:     Mental Status: He is alert.     Diagnostics:    Spirometry was performed and demonstrated an FEV1 of 2.56 at 102 % of predicted.  Assessment and Plan:   1. Not well controlled severe persistent asthma   2. Perennial allergic rhinitis   3. Gastroesophageal reflux disease, esophagitis presence not specified     1.  Allergen avoidance measures - DUST MITE  2.  Continue to Treat and prevent lung inflammation:   A.  Dulera 200 -2 inhalations in 2 times a day with spacer  B.  Flovent 110 - 2 inhalations 2 times a day with spacer  3. Continue to treat and prevent nasal inflammation:   A.  OTC Nasacort -1 spray each nostril at betime  B. Afrin - 1 spray single nostril at bedtime. Alternate nostrils daily  4.  Continue to Treat and prevent reflux:   A.  Esomeprazole 40mg  tablet in a.m.  B.  Famotidine 40 mg tablet in p.m.  5.  If needed:    A.  Albuterol MDI 2 inhalations every 4-6 hours  B. OTC antihistamine  6.  Return to clinic in 12 weeks or earlier if problem  7. 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  As well, I recommended that if he is going to use Afrin on a regular basis he should pare it up together with a nasal steroid which should help him not develop significant rebound from the use of this nasal decongestant spray.  He will continue to treat reflux as noted above.  I will see him back in this clinic in 12 weeks or earlier if there is a problem.  Allena Katz, MD Allergy / Immunology [place]

## 2018-10-10 NOTE — Patient Instructions (Addendum)
  1.  Allergen avoidance measures - DUST MITE  2.  Continue to Treat and prevent lung inflammation:   A.  Dulera 200 -2 inhalations in 2 times a day with spacer  B.  Flovent 110 - 2 inhalations 2 times a day with spacer  3. Continue to treat and prevent nasal inflammation:   A.  OTC Nasacort -1 spray each nostril at betime  B. Afrin - 1 spray single nostril at bedtime. Alternate nostrils daily  4.  Continue to Treat and prevent reflux:   A.  Esomeprazole 40mg  tablet in a.m.  B.  Famotidine 40 mg tablet in p.m.  5.  If needed:   A.  Albuterol MDI 2 inhalations every 4-6 hours  B. OTC antihistamine  6.  Return to clinic in 12 weeks or earlier if problem  7. Obtain fall flu vaccine (and COVID vaccine)

## 2018-10-11 ENCOUNTER — Encounter: Payer: Self-pay | Admitting: Allergy and Immunology

## 2018-10-18 DIAGNOSIS — G4733 Obstructive sleep apnea (adult) (pediatric): Secondary | ICD-10-CM | POA: Diagnosis not present

## 2018-10-24 DIAGNOSIS — R970 Elevated carcinoembryonic antigen [CEA]: Secondary | ICD-10-CM | POA: Diagnosis not present

## 2018-10-24 DIAGNOSIS — E782 Mixed hyperlipidemia: Secondary | ICD-10-CM | POA: Diagnosis not present

## 2018-10-24 DIAGNOSIS — Z1389 Encounter for screening for other disorder: Secondary | ICD-10-CM | POA: Diagnosis not present

## 2018-10-24 DIAGNOSIS — E559 Vitamin D deficiency, unspecified: Secondary | ICD-10-CM | POA: Diagnosis not present

## 2018-10-24 DIAGNOSIS — I2510 Atherosclerotic heart disease of native coronary artery without angina pectoris: Secondary | ICD-10-CM | POA: Diagnosis not present

## 2018-10-24 DIAGNOSIS — Z79899 Other long term (current) drug therapy: Secondary | ICD-10-CM | POA: Diagnosis not present

## 2018-10-24 DIAGNOSIS — I10 Essential (primary) hypertension: Secondary | ICD-10-CM | POA: Diagnosis not present

## 2018-10-24 DIAGNOSIS — Z23 Encounter for immunization: Secondary | ICD-10-CM | POA: Diagnosis not present

## 2018-10-24 DIAGNOSIS — G479 Sleep disorder, unspecified: Secondary | ICD-10-CM | POA: Diagnosis not present

## 2018-10-24 DIAGNOSIS — E119 Type 2 diabetes mellitus without complications: Secondary | ICD-10-CM | POA: Diagnosis not present

## 2018-10-24 DIAGNOSIS — Z0001 Encounter for general adult medical examination with abnormal findings: Secondary | ICD-10-CM | POA: Diagnosis not present

## 2018-11-13 ENCOUNTER — Other Ambulatory Visit: Payer: Self-pay | Admitting: *Deleted

## 2018-11-13 MED ORDER — ESOMEPRAZOLE MAGNESIUM 40 MG PO CPDR: DELAYED_RELEASE_CAPSULE | ORAL | 5 refills | Status: DC

## 2018-11-29 DIAGNOSIS — I10 Essential (primary) hypertension: Secondary | ICD-10-CM | POA: Diagnosis not present

## 2018-11-29 DIAGNOSIS — E782 Mixed hyperlipidemia: Secondary | ICD-10-CM | POA: Diagnosis not present

## 2018-11-29 DIAGNOSIS — I2510 Atherosclerotic heart disease of native coronary artery without angina pectoris: Secondary | ICD-10-CM | POA: Diagnosis not present

## 2018-11-29 DIAGNOSIS — J45909 Unspecified asthma, uncomplicated: Secondary | ICD-10-CM | POA: Diagnosis not present

## 2018-11-29 DIAGNOSIS — Z8546 Personal history of malignant neoplasm of prostate: Secondary | ICD-10-CM | POA: Diagnosis not present

## 2018-11-29 DIAGNOSIS — C61 Malignant neoplasm of prostate: Secondary | ICD-10-CM | POA: Diagnosis not present

## 2018-11-29 DIAGNOSIS — M1711 Unilateral primary osteoarthritis, right knee: Secondary | ICD-10-CM | POA: Diagnosis not present

## 2018-11-29 DIAGNOSIS — E119 Type 2 diabetes mellitus without complications: Secondary | ICD-10-CM | POA: Diagnosis not present

## 2018-11-29 DIAGNOSIS — J4520 Mild intermittent asthma, uncomplicated: Secondary | ICD-10-CM | POA: Diagnosis not present

## 2018-11-29 DIAGNOSIS — J45901 Unspecified asthma with (acute) exacerbation: Secondary | ICD-10-CM | POA: Diagnosis not present

## 2018-11-29 DIAGNOSIS — E7800 Pure hypercholesterolemia, unspecified: Secondary | ICD-10-CM | POA: Diagnosis not present

## 2018-12-04 DIAGNOSIS — H18832 Recurrent erosion of cornea, left eye: Secondary | ICD-10-CM | POA: Diagnosis not present

## 2018-12-04 DIAGNOSIS — H18592 Other hereditary corneal dystrophies, left eye: Secondary | ICD-10-CM | POA: Diagnosis not present

## 2018-12-04 DIAGNOSIS — Z961 Presence of intraocular lens: Secondary | ICD-10-CM | POA: Diagnosis not present

## 2019-01-02 ENCOUNTER — Ambulatory Visit (INDEPENDENT_AMBULATORY_CARE_PROVIDER_SITE_OTHER): Payer: Medicare Other | Admitting: Allergy and Immunology

## 2019-01-02 ENCOUNTER — Encounter: Payer: Self-pay | Admitting: Allergy and Immunology

## 2019-01-02 ENCOUNTER — Other Ambulatory Visit: Payer: Self-pay

## 2019-01-02 VITALS — BP 142/76 | HR 65 | Temp 97.6°F | Resp 20

## 2019-01-02 DIAGNOSIS — J4550 Severe persistent asthma, uncomplicated: Secondary | ICD-10-CM | POA: Diagnosis not present

## 2019-01-02 DIAGNOSIS — J3089 Other allergic rhinitis: Secondary | ICD-10-CM | POA: Diagnosis not present

## 2019-01-02 DIAGNOSIS — K219 Gastro-esophageal reflux disease without esophagitis: Secondary | ICD-10-CM

## 2019-01-02 NOTE — Patient Instructions (Addendum)
  1.  Allergen avoidance measures - DUST MITE  2.  Continue to Treat and prevent lung inflammation:   A.  Dulera 200 -2 inhalations in 2 times a day with spacer  B.  Decrease Flovent 110 - 2 inhalations 1 time a day with spacer  3. Continue to treat and prevent nasal inflammation:   A.  OTC Nasacort -1 spray each nostril at bedtime  B. Afrin - 1 spray single nostril at bedtime. Alternate nostrils daily  4.  Continue to Treat and prevent reflux:   A.  Esomeprazole 40mg  tablet in a.m.  B.  Famotidine 40 mg tablet in p.m.  5.  If needed:   A.  Albuterol MDI 2 inhalations every 4-6 hours  B. OTC antihistamine  6.  Return to clinic in 12 weeks or earlier if problem.  Taper medications?  7. Obtain Covid vaccine when available

## 2019-01-02 NOTE — Progress Notes (Signed)
[place]   Follow-up Note  Referring Provider: Josetta Huddle, MD Primary Provider: Josetta Huddle, MD Date of Office Visit: 01/02/2019  Subjective:   [name] (DOB: Aug 04, 1932) is a 85 y.o. 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  His last visit to this clinic was 10 October 2018.  He has really done very well during the interval without a requirement for systemic steroid or an antibiotic to treat any type of airway issue and rare use of a short acting bronchodilator.  He continues to utilize a combination of Dulera and Flovent at this point in time.  He has not been having any problems with his nose while consistently using Afrin single spray alternate nostrils at bedtime and continues to use an over-the-counter Nasacort spray.  Reflux has not been an issue.  He continues on a combination of a proton pump inhibitor and H2 receptor blocker.  He is now using a CPAP with nasal pillow for his sleep apnea.  He is having some difficulty adjusting to this appliance and probably received 4 hours of use per night.  He did receive the flu vaccine this year  Allergies as of 01/02/2019      Reactions   Lisinopril Cough   Sulfa Antibiotics Hives      Medication List    albuterol 108 (90 Base) MCG/ACT inhaler Commonly known as: VENTOLIN HFA Inhale 2 puffs into the lungs every 6 (six) hours as needed. For shortness of breath.   aspirin EC 81 MG tablet Take 81 mg by mouth daily.   atorvastatin 40 MG tablet Commonly known as: LIPITOR Take 40 mg by mouth daily.   CoQ10 400 MG Caps Take by mouth.   esomeprazole 40 MG capsule Commonly known as: NEXIUM TAKE 1 CAPSULE BY MOUTH EVERY DAY IN THE MORNING   famotidine 20 MG tablet Commonly known as: PEPCID Take 2 tablets every  evening as directed   fexofenadine 180 MG tablet Commonly known as: ALLEGRA Take 180 mg by mouth. Takes 2 tablets in the morning, 1 tablet 6 hours later, then 2 tablets 8 hours later.   Flovent HFA 110 MCG/ACT inhaler Generic drug: fluticasone   metFORMIN 500 MG tablet Commonly known as: GLUCOPHAGE Take 500-1,000 mg by mouth at bedtime. Take 500 mg every morning Take 1000 mg at bedtime   mometasone-formoterol 200-5 MCG/ACT Aero Commonly known as: Dulera Inhale 2 puffs twice daily with spacer to prevent cough/wheeze. Rinse, gargle and spit after each use.   vitamin B-12 1000 MCG tablet Commonly known as: CYANOCOBALAMIN Take 3,000 mcg by mouth daily.   VITAMIN D-1000 MAX ST PO Take 3,000 mg by mouth.       Past Medical History:  Diagnosis Date  . Asthma   . Cancer [facility])    prostate with seeds  . Coronary artery disease   . Diabetes mellitus   . High cholesterol     Past Surgical History:  Procedure Laterality Date  . CATARACT EXTRACTION, BILATERAL  1998, 2002  . CORONARY ARTERY BYPASS GRAFT    . HERNIA REPAIR    . INSERTION PROSTATE RADIATION SEED    . LEG SURGERY      Review of systems negative except as noted in HPI / PMHx or noted below:  Review of Systems  Constitutional:  Negative.   HENT: Negative.   Eyes: Negative.   Respiratory: Negative.   Cardiovascular: Negative.   Gastrointestinal: Negative.   Genitourinary: Negative.   Musculoskeletal: Negative.   Skin: Negative.   Neurological: Negative.   Endo/Heme/Allergies: Negative.   Psychiatric/Behavioral: Negative.      Objective:   Vitals:   01/02/19 0947  BP: (!) 142/76  Pulse: 65  Resp: 20  Temp: 97.6 F (36.4 C)  SpO2: 97%          Physical Exam Constitutional:      Appearance: He is not diaphoretic.  HENT:     Head: Normocephalic.     Right Ear: Tympanic membrane, ear canal and external ear normal.     Left Ear: Tympanic membrane, ear canal and external ear normal.     Nose:  Nose normal. No mucosal edema or rhinorrhea.     Mouth/Throat:     Pharynx: Uvula midline. No oropharyngeal exudate.  Eyes:     Conjunctiva/sclera: Conjunctivae normal.  Neck:     Thyroid: No thyromegaly.     Trachea: Trachea normal. No tracheal tenderness or tracheal deviation.  Cardiovascular:     Rate and Rhythm: Normal rate and regular rhythm.     Heart sounds: Normal heart sounds, S1 normal and S2 normal. No murmur.  Pulmonary:     Effort: No respiratory distress.     Breath sounds: Normal breath sounds. No stridor. No wheezing or rales.  Lymphadenopathy:     Head:     Right side of head: No tonsillar adenopathy.     Left side of head: No tonsillar adenopathy.     Cervical: No cervical adenopathy.  Skin:    Findings: No erythema or rash.     Nails: There is no clubbing.   Neurological:     Mental Status: He is alert.     Diagnostics:    Spirometry was performed and demonstrated an FEV1 of 2.63 at 107 % of predicted.  The patient had an Asthma Control Test with the following results: ACT Total Score: 25.    Assessment and Plan:   1. Asthma, severe persistent, well-controlled   2. Perennial allergic rhinitis   3. LPRD (laryngopharyngeal reflux disease)     1.  Allergen avoidance measures - DUST MITE  2.  Continue to Treat and prevent lung inflammation:   A.  Dulera 200 -2 inhalations in 2 times a day with spacer  B.  Decrease Flovent 110 - 2 inhalations 1 time a day with spacer  3. Continue to treat and prevent nasal inflammation:   A.  OTC Nasacort -1 spray each nostril at bedtime  B. Afrin - 1 spray single nostril at bedtime. Alternate nostrils daily  4.  Continue to Treat and prevent reflux:   A.  Esomeprazole 40mg  tablet in a.m.  B.  Famotidine 40 mg tablet in p.m.  5.  If needed:   A.  Albuterol MDI 2 inhalations every 4-6 hours  B. OTC antihistamine  6.  Return to clinic in 12 weeks or earlier if problem.  Taper medications?  7. 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  We will see if we can consolidate some of his treatment today by decreasing his Flovent 50%.  I will see him back in his clinic in 12 weeks and if he continues to do  well there may be an opportunity to further consolidate his treatment.  Allena Katz, MD Allergy / Immunology [place]

## 2019-01-03 ENCOUNTER — Encounter: Payer: Self-pay | Admitting: Allergy and Immunology

## 2019-01-10 ENCOUNTER — Ambulatory Visit: Payer: Self-pay

## 2019-01-10 ENCOUNTER — Other Ambulatory Visit: Payer: Self-pay

## 2019-01-10 MED ORDER — DULERA 200-5 MCG/ACT IN AERO: INHALATION_SPRAY | RESPIRATORY_TRACT | 5 refills | Status: AC

## 2019-01-10 MED ORDER — ESOMEPRAZOLE MAGNESIUM 40 MG PO CPDR: DELAYED_RELEASE_CAPSULE | ORAL | 1 refills | Status: DC

## 2019-01-29 DIAGNOSIS — G4733 Obstructive sleep apnea (adult) (pediatric): Secondary | ICD-10-CM | POA: Diagnosis not present

## 2019-03-01 DIAGNOSIS — J45901 Unspecified asthma with (acute) exacerbation: Secondary | ICD-10-CM | POA: Diagnosis not present

## 2019-03-01 DIAGNOSIS — J45909 Unspecified asthma, uncomplicated: Secondary | ICD-10-CM | POA: Diagnosis not present

## 2019-03-01 DIAGNOSIS — I2510 Atherosclerotic heart disease of native coronary artery without angina pectoris: Secondary | ICD-10-CM | POA: Diagnosis not present

## 2019-03-01 DIAGNOSIS — I10 Essential (primary) hypertension: Secondary | ICD-10-CM | POA: Diagnosis not present

## 2019-03-01 DIAGNOSIS — E782 Mixed hyperlipidemia: Secondary | ICD-10-CM | POA: Diagnosis not present

## 2019-03-01 DIAGNOSIS — J4520 Mild intermittent asthma, uncomplicated: Secondary | ICD-10-CM | POA: Diagnosis not present

## 2019-03-01 DIAGNOSIS — E7800 Pure hypercholesterolemia, unspecified: Secondary | ICD-10-CM | POA: Diagnosis not present

## 2019-03-01 DIAGNOSIS — M1711 Unilateral primary osteoarthritis, right knee: Secondary | ICD-10-CM | POA: Diagnosis not present

## 2019-03-01 DIAGNOSIS — Z86010 Personal history of colonic polyps: Secondary | ICD-10-CM | POA: Diagnosis not present

## 2019-03-01 DIAGNOSIS — Z8546 Personal history of malignant neoplasm of prostate: Secondary | ICD-10-CM | POA: Diagnosis not present

## 2019-03-01 DIAGNOSIS — R970 Elevated carcinoembryonic antigen [CEA]: Secondary | ICD-10-CM | POA: Diagnosis not present

## 2019-03-01 DIAGNOSIS — E119 Type 2 diabetes mellitus without complications: Secondary | ICD-10-CM | POA: Diagnosis not present

## 2019-03-01 DIAGNOSIS — C61 Malignant neoplasm of prostate: Secondary | ICD-10-CM | POA: Diagnosis not present

## 2019-03-07 ENCOUNTER — Encounter: Payer: PRIVATE HEALTH INSURANCE | Admitting: Psychology

## 2019-03-07 ENCOUNTER — Ambulatory Visit: Payer: Medicare Other | Attending: Internal Medicine

## 2019-03-07 DIAGNOSIS — Z23 Encounter for immunization: Secondary | ICD-10-CM | POA: Insufficient documentation

## 2019-03-07 NOTE — Progress Notes (Signed)
   Covid-19 Vaccination Clinic  Name:  [name]    MRN: [id number] DOB: January 15, 1933  03/07/2019  [name] was observed post Covid-19 immunization for 15 minutes without incidence. He was provided with Vaccine Information Sheet and instruction to access the V-Safe system.   [name] was instructed to call 911 with any severe reactions post vaccine: Marland Kitchen Difficulty breathing  . Swelling of your face and throat  . A fast heartbeat  . A bad rash all over your body  . Dizziness and weakness    Immunizations Administered    Name Date Dose VIS Date Route   Pfizer COVID-19 Vaccine 03/07/2019 10:51 AM 0.3 mL 01/26/2019 Intramuscular   Manufacturer: [place]   Lot: [id number]   [place]: [id number]

## 2019-03-14 ENCOUNTER — Ambulatory Visit: Payer: PRIVATE HEALTH INSURANCE

## 2019-03-15 ENCOUNTER — Encounter: Payer: PRIVATE HEALTH INSURANCE | Admitting: Psychology

## 2019-03-28 ENCOUNTER — Ambulatory Visit: Payer: Medicare Other | Attending: Internal Medicine

## 2019-03-28 DIAGNOSIS — Z23 Encounter for immunization: Secondary | ICD-10-CM | POA: Insufficient documentation

## 2019-03-28 NOTE — Progress Notes (Signed)
   Covid-19 Vaccination Clinic  Name:  [name]    MRN: [id number] DOB: 22-Sep-1932  03/28/2019  [name] was observed post Covid-19 immunization for 15 minutes without incidence. He was provided with Vaccine Information Sheet and instruction to access the V-Safe system.   [name] was instructed to call 911 with any severe reactions post vaccine: Marland Kitchen Difficulty breathing  . Swelling of your face and throat  . A fast heartbeat  . A bad rash all over your body  . Dizziness and weakness    Immunizations Administered    Name Date Dose VIS Date Route   Pfizer COVID-19 Vaccine 03/28/2019  2:38 PM 0.3 mL 01/26/2019 Intramuscular   Manufacturer: [place]   Lot: [id number]   [place]: [id number]

## 2019-04-03 ENCOUNTER — Other Ambulatory Visit: Payer: Self-pay

## 2019-04-03 ENCOUNTER — Encounter: Payer: Self-pay | Admitting: Allergy and Immunology

## 2019-04-03 ENCOUNTER — Ambulatory Visit (INDEPENDENT_AMBULATORY_CARE_PROVIDER_SITE_OTHER): Payer: Medicare Other | Admitting: Allergy and Immunology

## 2019-04-03 VITALS — BP 137/72 | HR 68 | Temp 96.5°F | Resp 20

## 2019-04-03 DIAGNOSIS — J4550 Severe persistent asthma, uncomplicated: Secondary | ICD-10-CM

## 2019-04-03 DIAGNOSIS — J3089 Other allergic rhinitis: Secondary | ICD-10-CM

## 2019-04-03 DIAGNOSIS — K219 Gastro-esophageal reflux disease without esophagitis: Secondary | ICD-10-CM | POA: Diagnosis not present

## 2019-04-03 NOTE — Progress Notes (Signed)
[facility]   Follow-up Note  Referring Provider: Josetta Huddle, MD Primary Provider: Josetta Huddle, MD Date of Office Visit: 04/03/2019  Subjective:   [name] (DOB: [birth year]) is a 86 y.o. 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  His last visit to this clinic was 02 January 2019.  His asthma has been under an excellent control on his current plan of action which includes Dulera on a consistent basis and a low-dose of inhaled Flovent.  Unfortunately, on Valentine's Day he did get exposure to roses and had exposure to an indoor cat and had a little bit of chest tightness and had to use his bronchodilator.  This is the first time he had to use his bronchodilator since his last visit.  He has not required any systemic steroid or antibiotic for any type of airway issue.  His nose is really doing quite well on his current plan of Nasacort and alternating nostril Afrin use per night and his sleep apnea is going well while utilizing his positive pressure apparatus currently up to 7-9 hours/day.  His reflux is under excellent control at this point in time.  He did receive a pair of Covid vaccinations.  Allergies as of 04/03/2019      Reactions   Lisinopril Cough   Sulfa Antibiotics Hives      Medication List       Accurate as of April 03, 2019 12:11 PM. If you have any questions, ask your nurse or doctor.        albuterol 108 (90 Base) MCG/ACT inhaler Commonly known as: VENTOLIN HFA Inhale 2 puffs into the lungs every 6 (six) hours as needed. For shortness of breath.   aspirin EC 81 MG tablet Take 81 mg by mouth daily.   atorvastatin 40 MG tablet Commonly known as: LIPITOR Take 40 mg by mouth daily.   CoQ10 400 MG Caps Take by mouth.   Dulera 200-5 MCG/ACT  Aero Generic drug: mometasone-formoterol Inhale 2 puffs twice daily with spacer to prevent cough/wheeze. Rinse, gargle and spit after each use.   esomeprazole 40 MG capsule Commonly known as: NEXIUM TAKE 1 CAPSULE BY MOUTH EVERY DAY IN THE MORNING   famotidine 20 MG tablet Commonly known as: PEPCID Take 2 tablets every evening as directed   fexofenadine 180 MG tablet Commonly known as: ALLEGRA Take 180 mg by mouth. Takes 2 tablets in the morning, 1 tablet 6 hours later, then 2 tablets 8 hours later.   Flovent HFA 110 MCG/ACT inhaler Generic drug: fluticasone   metFORMIN 500 MG tablet Commonly known as: GLUCOPHAGE Take 500-1,000 mg by mouth at bedtime. Take 500 mg every morning Take 1000 mg at bedtime   vitamin B-12 1000 MCG tablet Commonly known as: CYANOCOBALAMIN Take 3,000 mcg by mouth daily.   VITAMIN D-1000 MAX ST PO Take 3,000 mg by mouth.       Past Medical History:  Diagnosis Date  . Asthma   . Cancer [facility])    prostate with seeds  . Coronary artery disease   . Diabetes mellitus   . High cholesterol     Past Surgical History:  Procedure Laterality Date  . CATARACT EXTRACTION, BILATERAL  1998, 2002  . CORONARY ARTERY BYPASS GRAFT    . HERNIA REPAIR    .  INSERTION PROSTATE RADIATION SEED    . LEG SURGERY      Review of systems negative except as noted in HPI / PMHx or noted below:  Review of Systems  Constitutional: Negative.   HENT: Negative.   Eyes: Negative.   Respiratory: Negative.   Cardiovascular: Negative.   Gastrointestinal: Negative.   Genitourinary: Negative.   Musculoskeletal: Negative.   Skin: Negative.   Neurological: Negative.   Endo/Heme/Allergies: Negative.   Psychiatric/Behavioral: Negative.      Objective:   Vitals:   04/03/19 1105  BP: 137/72  Pulse: 68  Resp: 20  Temp: (!) 96.5 F (35.8 C)  SpO2: 96%          Physical Exam Constitutional:      Appearance: He is not diaphoretic.  HENT:     Head:  Normocephalic.     Right Ear: Tympanic membrane, ear canal and external ear normal.     Left Ear: Tympanic membrane, ear canal and external ear normal.     Nose: Nose normal. No mucosal edema or rhinorrhea.     Mouth/Throat:     Pharynx: Uvula midline. No oropharyngeal exudate.  Eyes:     Conjunctiva/sclera: Conjunctivae normal.  Neck:     Thyroid: No thyromegaly.     Trachea: Trachea normal. No tracheal tenderness or tracheal deviation.  Cardiovascular:     Rate and Rhythm: Normal rate and regular rhythm.     Heart sounds: Normal heart sounds, S1 normal and S2 normal. No murmur.  Pulmonary:     Effort: No respiratory distress.     Breath sounds: Normal breath sounds. No stridor. No wheezing or rales.  Lymphadenopathy:     Head:     Right side of head: No tonsillar adenopathy.     Left side of head: No tonsillar adenopathy.     Cervical: No cervical adenopathy.  Skin:    Findings: No erythema or rash.     Nails: There is no clubbing.  Neurological:     Mental Status: He is alert.     Diagnostics:    Spirometry was performed and demonstrated an FEV1 of 2.49 at 101 % of predicted.  The patient had an Asthma Control Test with the following results: ACT Total Score: 18.    Assessment and Plan:   1. Asthma, severe persistent, well-controlled   2. Perennial allergic rhinitis   3. LPRD (laryngopharyngeal reflux disease)     1.  Allergen avoidance measures - dust mite, pollens  2.  Continue to Treat and prevent lung inflammation:   A.  Dulera 200 -2 inhalations in 2 times a day with spacer  B.  Flovent 110 - 2 inhalations 0-2 time a day with spacer  3. Continue to treat and prevent nasal inflammation:   A.  OTC Nasacort -1 spray each nostril at bedtime  B. Afrin - 1 spray single nostril at bedtime. Alternate nostrils daily  4.  Continue to Treat and prevent reflux:   A.  Esomeprazole 40mg  tablet in a.m.  B.  Famotidine 40 mg tablet in p.m if needed  5.  If  needed:   A.  Albuterol MDI 2 inhalations every 4-6 hours  B. OTC antihistamine  6.  Return to clinic in 6 months or earlier if problem.     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  He has a very good understanding of his disease state and appropriate dosing of  his medications.  If he is doing very well he can attempt to discontinue his Flovent and he can increase the dose up to 220 mcg twice a day should be develop more asthma activity.  He has the option of eliminating the use of his famotidine if he believes that his reflux is doing well.  Assuming everything goes okay on his plan noted above I will see him back in this clinic in 6 months or earlier if there is a problem.  Allena Katz, MD Allergy / Immunology [place]

## 2019-04-03 NOTE — Patient Instructions (Addendum)
  1.  Allergen avoidance measures - dust mite, pollens  2.  Continue to Treat and prevent lung inflammation:   A.  Dulera 200 -2 inhalations in 2 times a day with spacer  B.  Flovent 110 - 2 inhalations 0-2 time a day with spacer  3. Continue to treat and prevent nasal inflammation:   A.  OTC Nasacort -1 spray each nostril at bedtime  B. Afrin - 1 spray single nostril at bedtime. Alternate nostrils daily  4.  Continue to Treat and prevent reflux:   A.  Esomeprazole 40mg  tablet in a.m.  B.  Famotidine 40 mg tablet in p.m if needed  5.  If needed:   A.  Albuterol MDI 2 inhalations every 4-6 hours  B. OTC antihistamine  6.  Return to clinic in 6 months or earlier if problem.

## 2019-04-04 ENCOUNTER — Encounter: Payer: Self-pay | Admitting: Allergy and Immunology

## 2019-04-09 DIAGNOSIS — Z8546 Personal history of malignant neoplasm of prostate: Secondary | ICD-10-CM | POA: Diagnosis not present

## 2019-04-09 DIAGNOSIS — I2510 Atherosclerotic heart disease of native coronary artery without angina pectoris: Secondary | ICD-10-CM | POA: Diagnosis not present

## 2019-04-09 DIAGNOSIS — E782 Mixed hyperlipidemia: Secondary | ICD-10-CM | POA: Diagnosis not present

## 2019-04-09 DIAGNOSIS — J4520 Mild intermittent asthma, uncomplicated: Secondary | ICD-10-CM | POA: Diagnosis not present

## 2019-04-09 DIAGNOSIS — E7800 Pure hypercholesterolemia, unspecified: Secondary | ICD-10-CM | POA: Diagnosis not present

## 2019-04-09 DIAGNOSIS — I10 Essential (primary) hypertension: Secondary | ICD-10-CM | POA: Diagnosis not present

## 2019-04-09 DIAGNOSIS — M1711 Unilateral primary osteoarthritis, right knee: Secondary | ICD-10-CM | POA: Diagnosis not present

## 2019-04-09 DIAGNOSIS — E119 Type 2 diabetes mellitus without complications: Secondary | ICD-10-CM | POA: Diagnosis not present

## 2019-04-09 DIAGNOSIS — C61 Malignant neoplasm of prostate: Secondary | ICD-10-CM | POA: Diagnosis not present

## 2019-04-09 DIAGNOSIS — J45909 Unspecified asthma, uncomplicated: Secondary | ICD-10-CM | POA: Diagnosis not present

## 2019-04-09 DIAGNOSIS — Z7984 Long term (current) use of oral hypoglycemic drugs: Secondary | ICD-10-CM | POA: Diagnosis not present

## 2019-04-29 DIAGNOSIS — K6389 Other specified diseases of intestine: Secondary | ICD-10-CM | POA: Diagnosis not present

## 2019-04-29 DIAGNOSIS — R911 Solitary pulmonary nodule: Secondary | ICD-10-CM | POA: Diagnosis not present

## 2019-04-29 DIAGNOSIS — R970 Elevated carcinoembryonic antigen [CEA]: Secondary | ICD-10-CM | POA: Diagnosis not present

## 2019-04-29 DIAGNOSIS — Z86010 Personal history of colonic polyps: Secondary | ICD-10-CM | POA: Diagnosis not present

## 2019-05-10 DIAGNOSIS — Z8546 Personal history of malignant neoplasm of prostate: Secondary | ICD-10-CM | POA: Diagnosis not present

## 2019-05-10 DIAGNOSIS — J45901 Unspecified asthma with (acute) exacerbation: Secondary | ICD-10-CM | POA: Diagnosis not present

## 2019-05-10 DIAGNOSIS — E7800 Pure hypercholesterolemia, unspecified: Secondary | ICD-10-CM | POA: Diagnosis not present

## 2019-05-10 DIAGNOSIS — J4520 Mild intermittent asthma, uncomplicated: Secondary | ICD-10-CM | POA: Diagnosis not present

## 2019-05-10 DIAGNOSIS — C61 Malignant neoplasm of prostate: Secondary | ICD-10-CM | POA: Diagnosis not present

## 2019-05-10 DIAGNOSIS — E119 Type 2 diabetes mellitus without complications: Secondary | ICD-10-CM | POA: Diagnosis not present

## 2019-05-10 DIAGNOSIS — M1711 Unilateral primary osteoarthritis, right knee: Secondary | ICD-10-CM | POA: Diagnosis not present

## 2019-05-10 DIAGNOSIS — E782 Mixed hyperlipidemia: Secondary | ICD-10-CM | POA: Diagnosis not present

## 2019-05-10 DIAGNOSIS — I10 Essential (primary) hypertension: Secondary | ICD-10-CM | POA: Diagnosis not present

## 2019-05-10 DIAGNOSIS — J45909 Unspecified asthma, uncomplicated: Secondary | ICD-10-CM | POA: Diagnosis not present

## 2019-05-10 DIAGNOSIS — I2510 Atherosclerotic heart disease of native coronary artery without angina pectoris: Secondary | ICD-10-CM | POA: Diagnosis not present

## 2019-05-10 DIAGNOSIS — Z7984 Long term (current) use of oral hypoglycemic drugs: Secondary | ICD-10-CM | POA: Diagnosis not present

## 2019-05-15 DIAGNOSIS — Z8546 Personal history of malignant neoplasm of prostate: Secondary | ICD-10-CM | POA: Diagnosis not present

## 2019-05-15 DIAGNOSIS — J4520 Mild intermittent asthma, uncomplicated: Secondary | ICD-10-CM | POA: Diagnosis not present

## 2019-05-15 DIAGNOSIS — L820 Inflamed seborrheic keratosis: Secondary | ICD-10-CM | POA: Diagnosis not present

## 2019-05-15 DIAGNOSIS — R970 Elevated carcinoembryonic antigen [CEA]: Secondary | ICD-10-CM | POA: Diagnosis not present

## 2019-05-15 DIAGNOSIS — R413 Other amnesia: Secondary | ICD-10-CM | POA: Diagnosis not present

## 2019-05-25 ENCOUNTER — Telehealth: Payer: Self-pay | Admitting: Allergy and Immunology

## 2019-05-25 NOTE — Telephone Encounter (Signed)
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 Neldon Mc to call him.  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 Bruna Potter opinion on a really good strong antihistamine.  Please advise.

## 2019-05-26 ENCOUNTER — Other Ambulatory Visit: Payer: Self-pay | Admitting: Allergy and Immunology

## 2019-05-28 NOTE — Telephone Encounter (Signed)
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

## 2019-05-28 NOTE — Telephone Encounter (Signed)
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 Neldon Mc personally.  He states it will take about 5 minutes.  He would like to discuss his asthma attack he had last Saturday.  He said to please call him at [phone].

## 2019-05-28 NOTE — Telephone Encounter (Signed)
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

## 2019-05-28 NOTE — Telephone Encounter (Signed)
Breathing is better, he did have an exacerbation on Saturday but is doing better now. He wants you to know that he is taking the following.   Taking 10 mg Prednisone daily (tapering) Chlor-Trimeton 24mg  daily  Allegra 180 big

## 2019-06-05 ENCOUNTER — Other Ambulatory Visit: Payer: Self-pay

## 2019-06-05 ENCOUNTER — Ambulatory Visit (INDEPENDENT_AMBULATORY_CARE_PROVIDER_SITE_OTHER): Payer: Medicare Other | Admitting: Ophthalmology

## 2019-06-05 ENCOUNTER — Encounter (INDEPENDENT_AMBULATORY_CARE_PROVIDER_SITE_OTHER): Payer: Self-pay | Admitting: Ophthalmology

## 2019-06-05 DIAGNOSIS — H34212 Partial retinal artery occlusion, left eye: Secondary | ICD-10-CM | POA: Diagnosis not present

## 2019-06-05 DIAGNOSIS — J45901 Unspecified asthma with (acute) exacerbation: Secondary | ICD-10-CM

## 2019-06-05 DIAGNOSIS — E119 Type 2 diabetes mellitus without complications: Secondary | ICD-10-CM | POA: Diagnosis not present

## 2019-06-05 NOTE — Patient Instructions (Signed)
Macular telangiectasis (MAC-TEL), or parafoveal telangiectasis is a condition of "unknown" cause.  Findings in or near the macula (center of vision) consist of microaneurysms (leaking small capillaries), often with leakage of fluid which in the active phase can impact fine discriminatory vision, and in some cases trigger profound scarring in the macula, with severe permanent vision loss.  Standard treatment is observation and periodic examinations to monitor for treatable complications.   The cause  of this condition is "unknown".  However, the practice of Dr. Zadie Rhine has discovered an association with sleep apnea with its nightly periods of low oxygen in the blood stream (hypoxia), retained carbon dioxide (hypercapnia), associated with transient nocturnal hypertensive episodes.   More recently, some patients also been found to have advanced lung disease, whether asthma or COPD, with similar findings.  Dr. Zadie Rhine has been evaluating the association of sleep apnea, nightly hypoxia, and Macular telangiectasis for over 18 years.  Most patients are found to be noncompliant with sleep apnea therapy or testing in the past.  Resumption of CPAP or similar therapy is strongly recommended if ordered in the past.  Upon review of risk factors or findings positive for sleep apnea, more formal, extensive sleep laboratory or home testing, may be recommended.  Numerous patients, proven to have MAC-TEL, have improved or resolved their ey condition promptly, within weeks, of the use of nighttime oxygen supplementation or continuous positive airway pressure (CPAP).

## 2019-06-05 NOTE — Progress Notes (Signed)
06/05/2019     CHIEF COMPLAINT Patient presents for Diabetic Eye Exam   HISTORY OF PRESENT ILLNESS: [name] is a 86 y.o. male who presents to the clinic today for:   HPI    Diabetic Eye Exam    Vision is stable.  Associated Symptoms Floaters.  Diabetes characteristics include Type 2.  Blood sugar level is controlled.  Last Blood Glucose 126.  I, the attending physician,  performed the HPI with the patient and updated documentation appropriately.          Comments    9 Month Diabetic Exam OU. FP  Pt states on 3/20 he saw multiple "flying circles" in OS vision, this episode lasted 1 min. On 4/6 pt had another episode in OS that lasted 5 seconds. Pt states he did not see FOL.       Last edited by Tilda Franco on 06/05/2019 11:06 AM. (History)      Referring physician: Josetta Huddle, MD [address] [place],  [place] [address]  HISTORICAL INFORMATION:   Selected notes from the MEDICAL RECORD NUMBER    Lab Results  Component Value Date   HGBA1C 6.2 (H) 09/30/2011     CURRENT MEDICATIONS: No current outpatient medications on file. (Ophthalmic Drugs)   No current facility-administered medications for this visit. (Ophthalmic Drugs)   Current Outpatient Medications (Other)  Medication Sig  . albuterol (PROVENTIL HFA;VENTOLIN HFA) 108 (90 BASE) MCG/ACT inhaler Inhale 2 puffs into the lungs every 6 (six) hours as needed. For shortness of breath.  Marland Kitchen aspirin EC 81 MG tablet Take 81 mg by mouth daily.  Marland Kitchen atorvastatin (LIPITOR) 40 MG tablet Take 40 mg by mouth daily.  . Blood Glucose Monitoring Suppl ([place]) w/Device KIT [id number]  . Cholecalciferol (VITAMIN D-1000 MAX ST PO) Take 3,000 mg by mouth.  . Coenzyme Q10 (COQ10) 400 MG CAPS Take by mouth.  . esomeprazole (NEXIUM) 40 MG capsule TAKE 1 CAPSULE BY MOUTH  DAILY IN THE MORNING  . famotidine (PEPCID) 20 MG tablet Take 2 tablets every evening as directed (Patient taking  differently: Take 20 mg by mouth at bedtime. Take 2 tablets every evening as directed)  . fexofenadine (ALLEGRA) 180 MG tablet Take 180 mg by mouth. Takes 2 tablets in the morning, 1 tablet 6 hours later, then 2 tablets 8 hours later.  Marland Kitchen FLOVENT HFA 110 MCG/ACT inhaler   . IGLUCOSE TEST STRIPS test strip [id number]  . Lancets (STERILANCE TL) [place] [id number]  . metFORMIN (GLUCOPHAGE) 500 MG tablet Take 500-1,000 mg by mouth at bedtime. Take 500 mg every morning Take 1000 mg at bedtime  . mometasone-formoterol (DULERA) 200-5 MCG/ACT AERO Inhale 2 puffs twice daily with spacer to prevent cough/wheeze. Rinse, gargle and spit after each use.  . vitamin B-12 (CYANOCOBALAMIN) 1000 MCG tablet Take 3,000 mcg by mouth daily.   No current facility-administered medications for this visit. (Other)      REVIEW OF SYSTEMS: ROS    Positive for: Endocrine   Last edited by Tilda Franco on 06/05/2019 11:06 AM. (History)       ALLERGIES Allergies  Allergen Reactions  . Lisinopril Cough  . Sulfa Antibiotics Hives    PAST MEDICAL HISTORY Past Medical History:  Diagnosis Date  . Asthma   . Cancer [facility])    prostate with seeds  . Coronary artery disease   . Diabetes mellitus   . High cholesterol    Past Surgical History:  Procedure Laterality Date  . CATARACT EXTRACTION W/PHACO Right 1998   Dr. Karna Christmas  . CATARACT EXTRACTION W/PHACO Left 2003   Dr. Davonna Belling  . CATARACT EXTRACTION, BILATERAL  1998, 2002  . CORONARY ARTERY BYPASS GRAFT    . HERNIA REPAIR    . INSERTION PROSTATE RADIATION SEED    . LEG SURGERY      FAMILY HISTORY Family History  Problem Relation Age of Onset  . Heart attack Mother   . Prostate cancer Father   . Colon cancer Paternal Grandfather   . Allergic rhinitis Neg Hx   . Angioedema Neg Hx   . Asthma Neg Hx   . Atopy Neg Hx   . Eczema Neg Hx   . Immunodeficiency Neg Hx   . Urticaria Neg Hx     SOCIAL HISTORY Social History   Tobacco Use  . Smoking status:  Never Smoker  . Smokeless tobacco: Never Used  Substance Use Topics  . Alcohol use: Yes    Alcohol/week: 10.0 standard drinks    Types: 10 Standard drinks or equivalent per week    Comment: 1-2 drinks 4-5 days per week  . Drug use: No         OPHTHALMIC EXAM: Base Eye Exam    Visual Acuity (Snellen - Linear)      Right Left   Dist cc 20/20 20/20 -2   Correction: Glasses       Tonometry (Tonopen, 11:14 AM)      Right Left   Pressure 14 14       Pupils      Pupils Dark Light Shape React APD   Right PERRL 4 3 Round Brisk None   Left PERRL 4 3 Round Brisk None       Visual Fields (Counting fingers)      Left Right    Full Full       Neuro/Psych    Oriented x3: Yes   Mood/Affect: Normal       Dilation    Both eyes: 1.0% Mydriacyl, 2.5% Phenylephrine @ 11:14 AM        Slit Lamp and Fundus Exam    Slit Lamp Exam      Right Left   Lens Posterior chamber intraocular lens Posterior chamber intraocular lens          IMAGING AND PROCEDURES  Imaging and Procedures for 06/05/19           ASSESSMENT/PLAN:  No problem-specific Assessment & Plan notes found for this encounter.      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 Diabetes mellitus without complication ([place])  C94.4 Color Fundus Photography Optos - OU - Both Eyes  3. Extrinsic asthma with acute exacerbation, unspecified asthma severity, unspecified whether persistent  J45.901     1.  Sleep apnea confirmed, with 50 episodes per hour, continue CPAP use so as to prevent development of significant macular telangiectasis.  2.  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  3.  Ophthalmic Meds Ordered this visit:  No orders of the defined types were placed in this encounter.      No follow-ups on file.  There are no Patient Instructions on file for this visit.   Explained the diagnoses, plan, and follow  up with the patient and they expressed understanding.  Patient expressed understanding of the importance of proper follow up care.  Clent Demark Anaika Santillano M.D. Diseases & Surgery of the Retina and Vitreous Retina & Diabetic [place] 06/05/19     Abbreviations: M myopia (nearsighted); A astigmatism; H hyperopia (farsighted); P presbyopia; Mrx spectacle prescription;  CTL contact lenses; OD right eye; OS left eye; OU both eyes  XT exotropia; ET esotropia; PEK punctate epithelial keratitis; PEE punctate epithelial erosions; DES dry eye syndrome; MGD meibomian gland dysfunction; ATs artificial tears; PFAT's preservative free artificial tears; [place] nuclear sclerotic cataract; PSC posterior subcapsular cataract; ERM epi-retinal membrane; PVD posterior vitreous detachment; RD retinal detachment; DM diabetes mellitus; DR diabetic retinopathy; NPDR non-proliferative diabetic retinopathy; PDR proliferative diabetic retinopathy; CSME clinically significant macular edema; DME diabetic macular edema; dbh dot blot hemorrhages; CWS cotton wool spot; POAG primary open angle glaucoma; C/D cup-to-disc ratio; HVF humphrey visual field; GVF goldmann visual field; OCT optical coherence tomography; IOP intraocular pressure; BRVO Branch retinal vein occlusion; CRVO central retinal vein occlusion; CRAO central retinal artery occlusion; BRAO branch retinal artery occlusion; RT retinal tear; SB scleral buckle; PPV pars plana vitrectomy; VH Vitreous hemorrhage; PRP panretinal laser photocoagulation; IVK intravitreal kenalog; VMT vitreomacular traction; MH Macular hole;  NVD neovascularization of the disc; NVE neovascularization elsewhere; AREDS age related eye disease study; ARMD age related macular degeneration; POAG primary open angle glaucoma; EBMD epithelial/anterior basement membrane dystrophy; ACIOL anterior chamber intraocular lens; IOL intraocular lens; PCIOL posterior chamber intraocular lens; Phaco/IOL phacoemulsification with  intraocular lens placement; [place] photorefractive keratectomy; LASIK laser assisted in situ keratomileusis; HTN hypertension; DM diabetes mellitus; COPD chronic obstructive pulmonary disease

## 2019-06-07 DIAGNOSIS — E119 Type 2 diabetes mellitus without complications: Secondary | ICD-10-CM | POA: Diagnosis not present

## 2019-06-08 DIAGNOSIS — E119 Type 2 diabetes mellitus without complications: Secondary | ICD-10-CM | POA: Diagnosis not present

## 2019-06-08 DIAGNOSIS — J45909 Unspecified asthma, uncomplicated: Secondary | ICD-10-CM | POA: Diagnosis not present

## 2019-06-08 DIAGNOSIS — M1711 Unilateral primary osteoarthritis, right knee: Secondary | ICD-10-CM | POA: Diagnosis not present

## 2019-06-08 DIAGNOSIS — J45901 Unspecified asthma with (acute) exacerbation: Secondary | ICD-10-CM | POA: Diagnosis not present

## 2019-06-08 DIAGNOSIS — Z8546 Personal history of malignant neoplasm of prostate: Secondary | ICD-10-CM | POA: Diagnosis not present

## 2019-06-08 DIAGNOSIS — J4520 Mild intermittent asthma, uncomplicated: Secondary | ICD-10-CM | POA: Diagnosis not present

## 2019-06-08 DIAGNOSIS — E782 Mixed hyperlipidemia: Secondary | ICD-10-CM | POA: Diagnosis not present

## 2019-06-08 DIAGNOSIS — C61 Malignant neoplasm of prostate: Secondary | ICD-10-CM | POA: Diagnosis not present

## 2019-06-08 DIAGNOSIS — I2510 Atherosclerotic heart disease of native coronary artery without angina pectoris: Secondary | ICD-10-CM | POA: Diagnosis not present

## 2019-06-08 DIAGNOSIS — E7800 Pure hypercholesterolemia, unspecified: Secondary | ICD-10-CM | POA: Diagnosis not present

## 2019-06-08 DIAGNOSIS — I10 Essential (primary) hypertension: Secondary | ICD-10-CM | POA: Diagnosis not present

## 2019-06-08 DIAGNOSIS — Z7984 Long term (current) use of oral hypoglycemic drugs: Secondary | ICD-10-CM | POA: Diagnosis not present

## 2019-07-06 DIAGNOSIS — M1711 Unilateral primary osteoarthritis, right knee: Secondary | ICD-10-CM | POA: Diagnosis not present

## 2019-07-06 DIAGNOSIS — E119 Type 2 diabetes mellitus without complications: Secondary | ICD-10-CM | POA: Diagnosis not present

## 2019-07-06 DIAGNOSIS — J45901 Unspecified asthma with (acute) exacerbation: Secondary | ICD-10-CM | POA: Diagnosis not present

## 2019-07-06 DIAGNOSIS — R413 Other amnesia: Secondary | ICD-10-CM | POA: Diagnosis not present

## 2019-07-06 DIAGNOSIS — G4733 Obstructive sleep apnea (adult) (pediatric): Secondary | ICD-10-CM | POA: Diagnosis not present

## 2019-07-06 DIAGNOSIS — I2510 Atherosclerotic heart disease of native coronary artery without angina pectoris: Secondary | ICD-10-CM | POA: Diagnosis not present

## 2019-07-06 DIAGNOSIS — I10 Essential (primary) hypertension: Secondary | ICD-10-CM | POA: Diagnosis not present

## 2019-07-06 DIAGNOSIS — E782 Mixed hyperlipidemia: Secondary | ICD-10-CM | POA: Diagnosis not present

## 2019-07-06 DIAGNOSIS — E7800 Pure hypercholesterolemia, unspecified: Secondary | ICD-10-CM | POA: Diagnosis not present

## 2019-07-06 DIAGNOSIS — R970 Elevated carcinoembryonic antigen [CEA]: Secondary | ICD-10-CM | POA: Diagnosis not present

## 2019-07-06 DIAGNOSIS — C61 Malignant neoplasm of prostate: Secondary | ICD-10-CM | POA: Diagnosis not present

## 2019-07-06 DIAGNOSIS — J45909 Unspecified asthma, uncomplicated: Secondary | ICD-10-CM | POA: Diagnosis not present

## 2019-07-06 DIAGNOSIS — Z8546 Personal history of malignant neoplasm of prostate: Secondary | ICD-10-CM | POA: Diagnosis not present

## 2019-07-06 DIAGNOSIS — J4520 Mild intermittent asthma, uncomplicated: Secondary | ICD-10-CM | POA: Diagnosis not present

## 2019-07-06 DIAGNOSIS — R351 Nocturia: Secondary | ICD-10-CM | POA: Diagnosis not present

## 2019-07-12 DIAGNOSIS — R350 Frequency of micturition: Secondary | ICD-10-CM | POA: Diagnosis not present

## 2019-07-12 DIAGNOSIS — R351 Nocturia: Secondary | ICD-10-CM | POA: Diagnosis not present

## 2019-07-12 DIAGNOSIS — C61 Malignant neoplasm of prostate: Secondary | ICD-10-CM | POA: Diagnosis not present

## 2019-07-13 DIAGNOSIS — E119 Type 2 diabetes mellitus without complications: Secondary | ICD-10-CM | POA: Diagnosis not present

## 2019-07-20 DIAGNOSIS — I872 Venous insufficiency (chronic) (peripheral): Secondary | ICD-10-CM | POA: Diagnosis not present

## 2019-07-20 DIAGNOSIS — R609 Edema, unspecified: Secondary | ICD-10-CM | POA: Diagnosis not present

## 2019-09-11 DIAGNOSIS — J3089 Other allergic rhinitis: Secondary | ICD-10-CM | POA: Diagnosis not present

## 2019-09-11 DIAGNOSIS — J301 Allergic rhinitis due to pollen: Secondary | ICD-10-CM | POA: Diagnosis not present

## 2019-09-11 DIAGNOSIS — J4540 Moderate persistent asthma, uncomplicated: Secondary | ICD-10-CM | POA: Diagnosis not present

## 2019-09-13 DIAGNOSIS — J3089 Other allergic rhinitis: Secondary | ICD-10-CM | POA: Diagnosis not present

## 2019-09-14 DIAGNOSIS — E119 Type 2 diabetes mellitus without complications: Secondary | ICD-10-CM | POA: Diagnosis not present

## 2019-09-26 DIAGNOSIS — Z8546 Personal history of malignant neoplasm of prostate: Secondary | ICD-10-CM | POA: Diagnosis not present

## 2019-09-26 DIAGNOSIS — E119 Type 2 diabetes mellitus without complications: Secondary | ICD-10-CM | POA: Diagnosis not present

## 2019-09-26 DIAGNOSIS — I2510 Atherosclerotic heart disease of native coronary artery without angina pectoris: Secondary | ICD-10-CM | POA: Diagnosis not present

## 2019-09-26 DIAGNOSIS — E7800 Pure hypercholesterolemia, unspecified: Secondary | ICD-10-CM | POA: Diagnosis not present

## 2019-09-26 DIAGNOSIS — J45901 Unspecified asthma with (acute) exacerbation: Secondary | ICD-10-CM | POA: Diagnosis not present

## 2019-09-26 DIAGNOSIS — I10 Essential (primary) hypertension: Secondary | ICD-10-CM | POA: Diagnosis not present

## 2019-09-26 DIAGNOSIS — J4520 Mild intermittent asthma, uncomplicated: Secondary | ICD-10-CM | POA: Diagnosis not present

## 2019-09-26 DIAGNOSIS — M1711 Unilateral primary osteoarthritis, right knee: Secondary | ICD-10-CM | POA: Diagnosis not present

## 2019-09-26 DIAGNOSIS — E782 Mixed hyperlipidemia: Secondary | ICD-10-CM | POA: Diagnosis not present

## 2019-09-26 DIAGNOSIS — C61 Malignant neoplasm of prostate: Secondary | ICD-10-CM | POA: Diagnosis not present

## 2019-10-02 ENCOUNTER — Ambulatory Visit: Payer: Medicare Other | Admitting: Allergy and Immunology

## 2019-10-16 DIAGNOSIS — E119 Type 2 diabetes mellitus without complications: Secondary | ICD-10-CM | POA: Diagnosis not present

## 2019-10-30 DIAGNOSIS — Z0001 Encounter for general adult medical examination with abnormal findings: Secondary | ICD-10-CM | POA: Diagnosis not present

## 2019-10-30 DIAGNOSIS — J45909 Unspecified asthma, uncomplicated: Secondary | ICD-10-CM | POA: Diagnosis not present

## 2019-10-30 DIAGNOSIS — Z23 Encounter for immunization: Secondary | ICD-10-CM | POA: Diagnosis not present

## 2019-10-30 DIAGNOSIS — I872 Venous insufficiency (chronic) (peripheral): Secondary | ICD-10-CM | POA: Diagnosis not present

## 2019-10-30 DIAGNOSIS — E559 Vitamin D deficiency, unspecified: Secondary | ICD-10-CM | POA: Diagnosis not present

## 2019-10-30 DIAGNOSIS — I10 Essential (primary) hypertension: Secondary | ICD-10-CM | POA: Diagnosis not present

## 2019-10-30 DIAGNOSIS — Z8546 Personal history of malignant neoplasm of prostate: Secondary | ICD-10-CM | POA: Diagnosis not present

## 2019-10-30 DIAGNOSIS — E119 Type 2 diabetes mellitus without complications: Secondary | ICD-10-CM | POA: Diagnosis not present

## 2019-10-30 DIAGNOSIS — M1711 Unilateral primary osteoarthritis, right knee: Secondary | ICD-10-CM | POA: Diagnosis not present

## 2019-10-30 DIAGNOSIS — G4733 Obstructive sleep apnea (adult) (pediatric): Secondary | ICD-10-CM | POA: Diagnosis not present

## 2019-10-30 DIAGNOSIS — C61 Malignant neoplasm of prostate: Secondary | ICD-10-CM | POA: Diagnosis not present

## 2019-10-30 DIAGNOSIS — I2510 Atherosclerotic heart disease of native coronary artery without angina pectoris: Secondary | ICD-10-CM | POA: Diagnosis not present

## 2019-10-30 DIAGNOSIS — Z0000 Encounter for general adult medical examination without abnormal findings: Secondary | ICD-10-CM | POA: Diagnosis not present

## 2019-10-30 DIAGNOSIS — E7800 Pure hypercholesterolemia, unspecified: Secondary | ICD-10-CM | POA: Diagnosis not present

## 2019-10-30 DIAGNOSIS — R970 Elevated carcinoembryonic antigen [CEA]: Secondary | ICD-10-CM | POA: Diagnosis not present

## 2019-11-03 DIAGNOSIS — Z23 Encounter for immunization: Secondary | ICD-10-CM | POA: Diagnosis not present

## 2019-11-15 DIAGNOSIS — E119 Type 2 diabetes mellitus without complications: Secondary | ICD-10-CM | POA: Diagnosis not present

## 2019-11-20 DIAGNOSIS — E119 Type 2 diabetes mellitus without complications: Secondary | ICD-10-CM | POA: Diagnosis not present

## 2019-11-20 DIAGNOSIS — I2510 Atherosclerotic heart disease of native coronary artery without angina pectoris: Secondary | ICD-10-CM | POA: Diagnosis not present

## 2019-11-20 DIAGNOSIS — E782 Mixed hyperlipidemia: Secondary | ICD-10-CM | POA: Diagnosis not present

## 2019-11-20 DIAGNOSIS — I10 Essential (primary) hypertension: Secondary | ICD-10-CM | POA: Diagnosis not present

## 2019-11-20 DIAGNOSIS — C61 Malignant neoplasm of prostate: Secondary | ICD-10-CM | POA: Diagnosis not present

## 2019-11-20 DIAGNOSIS — E7800 Pure hypercholesterolemia, unspecified: Secondary | ICD-10-CM | POA: Diagnosis not present

## 2019-11-20 DIAGNOSIS — J45909 Unspecified asthma, uncomplicated: Secondary | ICD-10-CM | POA: Diagnosis not present

## 2019-11-20 DIAGNOSIS — Z8546 Personal history of malignant neoplasm of prostate: Secondary | ICD-10-CM | POA: Diagnosis not present

## 2019-11-20 DIAGNOSIS — J4520 Mild intermittent asthma, uncomplicated: Secondary | ICD-10-CM | POA: Diagnosis not present

## 2019-11-20 DIAGNOSIS — J45901 Unspecified asthma with (acute) exacerbation: Secondary | ICD-10-CM | POA: Diagnosis not present

## 2019-11-20 DIAGNOSIS — M1711 Unilateral primary osteoarthritis, right knee: Secondary | ICD-10-CM | POA: Diagnosis not present

## 2019-12-06 DIAGNOSIS — Z20822 Contact with and (suspected) exposure to covid-19: Secondary | ICD-10-CM | POA: Diagnosis not present

## 2019-12-14 DIAGNOSIS — E119 Type 2 diabetes mellitus without complications: Secondary | ICD-10-CM | POA: Diagnosis not present

## 2019-12-15 DIAGNOSIS — E119 Type 2 diabetes mellitus without complications: Secondary | ICD-10-CM | POA: Diagnosis not present

## 2019-12-25 DIAGNOSIS — E782 Mixed hyperlipidemia: Secondary | ICD-10-CM | POA: Diagnosis not present

## 2019-12-25 DIAGNOSIS — I2510 Atherosclerotic heart disease of native coronary artery without angina pectoris: Secondary | ICD-10-CM | POA: Diagnosis not present

## 2019-12-25 DIAGNOSIS — R351 Nocturia: Secondary | ICD-10-CM | POA: Diagnosis not present

## 2019-12-25 DIAGNOSIS — M1711 Unilateral primary osteoarthritis, right knee: Secondary | ICD-10-CM | POA: Diagnosis not present

## 2019-12-25 DIAGNOSIS — C61 Malignant neoplasm of prostate: Secondary | ICD-10-CM | POA: Diagnosis not present

## 2019-12-25 DIAGNOSIS — J45901 Unspecified asthma with (acute) exacerbation: Secondary | ICD-10-CM | POA: Diagnosis not present

## 2019-12-25 DIAGNOSIS — Z7984 Long term (current) use of oral hypoglycemic drugs: Secondary | ICD-10-CM | POA: Diagnosis not present

## 2019-12-25 DIAGNOSIS — I10 Essential (primary) hypertension: Secondary | ICD-10-CM | POA: Diagnosis not present

## 2019-12-25 DIAGNOSIS — E7800 Pure hypercholesterolemia, unspecified: Secondary | ICD-10-CM | POA: Diagnosis not present

## 2019-12-25 DIAGNOSIS — R413 Other amnesia: Secondary | ICD-10-CM | POA: Diagnosis not present

## 2019-12-25 DIAGNOSIS — Z8546 Personal history of malignant neoplasm of prostate: Secondary | ICD-10-CM | POA: Diagnosis not present

## 2019-12-25 DIAGNOSIS — K219 Gastro-esophageal reflux disease without esophagitis: Secondary | ICD-10-CM | POA: Diagnosis not present

## 2019-12-25 DIAGNOSIS — J45909 Unspecified asthma, uncomplicated: Secondary | ICD-10-CM | POA: Diagnosis not present

## 2019-12-25 DIAGNOSIS — E119 Type 2 diabetes mellitus without complications: Secondary | ICD-10-CM | POA: Diagnosis not present

## 2019-12-25 DIAGNOSIS — J4520 Mild intermittent asthma, uncomplicated: Secondary | ICD-10-CM | POA: Diagnosis not present

## 2020-01-07 DIAGNOSIS — G4733 Obstructive sleep apnea (adult) (pediatric): Secondary | ICD-10-CM | POA: Diagnosis not present

## 2020-01-14 DIAGNOSIS — E119 Type 2 diabetes mellitus without complications: Secondary | ICD-10-CM | POA: Diagnosis not present

## 2020-01-15 DIAGNOSIS — E119 Type 2 diabetes mellitus without complications: Secondary | ICD-10-CM | POA: Diagnosis not present

## 2020-02-14 DIAGNOSIS — J45909 Unspecified asthma, uncomplicated: Secondary | ICD-10-CM | POA: Diagnosis not present

## 2020-02-14 DIAGNOSIS — M1711 Unilateral primary osteoarthritis, right knee: Secondary | ICD-10-CM | POA: Diagnosis not present

## 2020-02-14 DIAGNOSIS — K219 Gastro-esophageal reflux disease without esophagitis: Secondary | ICD-10-CM | POA: Diagnosis not present

## 2020-02-14 DIAGNOSIS — J4520 Mild intermittent asthma, uncomplicated: Secondary | ICD-10-CM | POA: Diagnosis not present

## 2020-02-14 DIAGNOSIS — J45901 Unspecified asthma with (acute) exacerbation: Secondary | ICD-10-CM | POA: Diagnosis not present

## 2020-02-14 DIAGNOSIS — E7800 Pure hypercholesterolemia, unspecified: Secondary | ICD-10-CM | POA: Diagnosis not present

## 2020-02-14 DIAGNOSIS — C61 Malignant neoplasm of prostate: Secondary | ICD-10-CM | POA: Diagnosis not present

## 2020-02-14 DIAGNOSIS — I2510 Atherosclerotic heart disease of native coronary artery without angina pectoris: Secondary | ICD-10-CM | POA: Diagnosis not present

## 2020-02-14 DIAGNOSIS — Z8546 Personal history of malignant neoplasm of prostate: Secondary | ICD-10-CM | POA: Diagnosis not present

## 2020-02-14 DIAGNOSIS — E782 Mixed hyperlipidemia: Secondary | ICD-10-CM | POA: Diagnosis not present

## 2020-02-14 DIAGNOSIS — E119 Type 2 diabetes mellitus without complications: Secondary | ICD-10-CM | POA: Diagnosis not present

## 2020-02-14 DIAGNOSIS — I10 Essential (primary) hypertension: Secondary | ICD-10-CM | POA: Diagnosis not present

## 2020-02-15 DIAGNOSIS — E119 Type 2 diabetes mellitus without complications: Secondary | ICD-10-CM | POA: Diagnosis not present

## 2020-03-05 DIAGNOSIS — C61 Malignant neoplasm of prostate: Secondary | ICD-10-CM | POA: Diagnosis not present

## 2020-03-05 DIAGNOSIS — R351 Nocturia: Secondary | ICD-10-CM | POA: Diagnosis not present

## 2020-03-05 DIAGNOSIS — R3915 Urgency of urination: Secondary | ICD-10-CM | POA: Diagnosis not present

## 2020-03-06 DIAGNOSIS — E119 Type 2 diabetes mellitus without complications: Secondary | ICD-10-CM | POA: Diagnosis not present

## 2020-03-06 DIAGNOSIS — C61 Malignant neoplasm of prostate: Secondary | ICD-10-CM | POA: Diagnosis not present

## 2020-03-06 DIAGNOSIS — E7800 Pure hypercholesterolemia, unspecified: Secondary | ICD-10-CM | POA: Diagnosis not present

## 2020-03-06 DIAGNOSIS — J45909 Unspecified asthma, uncomplicated: Secondary | ICD-10-CM | POA: Diagnosis not present

## 2020-03-06 DIAGNOSIS — E782 Mixed hyperlipidemia: Secondary | ICD-10-CM | POA: Diagnosis not present

## 2020-03-06 DIAGNOSIS — I2510 Atherosclerotic heart disease of native coronary artery without angina pectoris: Secondary | ICD-10-CM | POA: Diagnosis not present

## 2020-03-06 DIAGNOSIS — J4520 Mild intermittent asthma, uncomplicated: Secondary | ICD-10-CM | POA: Diagnosis not present

## 2020-03-06 DIAGNOSIS — J45901 Unspecified asthma with (acute) exacerbation: Secondary | ICD-10-CM | POA: Diagnosis not present

## 2020-03-06 DIAGNOSIS — I10 Essential (primary) hypertension: Secondary | ICD-10-CM | POA: Diagnosis not present

## 2020-03-06 DIAGNOSIS — K219 Gastro-esophageal reflux disease without esophagitis: Secondary | ICD-10-CM | POA: Diagnosis not present

## 2020-03-11 DIAGNOSIS — R350 Frequency of micturition: Secondary | ICD-10-CM | POA: Diagnosis not present

## 2020-03-11 DIAGNOSIS — R3915 Urgency of urination: Secondary | ICD-10-CM | POA: Diagnosis not present

## 2020-03-17 DIAGNOSIS — E119 Type 2 diabetes mellitus without complications: Secondary | ICD-10-CM | POA: Diagnosis not present

## 2020-03-18 DIAGNOSIS — R350 Frequency of micturition: Secondary | ICD-10-CM | POA: Diagnosis not present

## 2020-03-18 DIAGNOSIS — R3915 Urgency of urination: Secondary | ICD-10-CM | POA: Diagnosis not present

## 2020-03-25 DIAGNOSIS — R350 Frequency of micturition: Secondary | ICD-10-CM | POA: Diagnosis not present

## 2020-03-25 DIAGNOSIS — R3915 Urgency of urination: Secondary | ICD-10-CM | POA: Diagnosis not present

## 2020-04-01 DIAGNOSIS — R3915 Urgency of urination: Secondary | ICD-10-CM | POA: Diagnosis not present

## 2020-04-01 DIAGNOSIS — R350 Frequency of micturition: Secondary | ICD-10-CM | POA: Diagnosis not present

## 2020-04-08 DIAGNOSIS — R350 Frequency of micturition: Secondary | ICD-10-CM | POA: Diagnosis not present

## 2020-04-10 DIAGNOSIS — J3089 Other allergic rhinitis: Secondary | ICD-10-CM | POA: Diagnosis not present

## 2020-04-10 DIAGNOSIS — J4540 Moderate persistent asthma, uncomplicated: Secondary | ICD-10-CM | POA: Diagnosis not present

## 2020-04-10 DIAGNOSIS — L299 Pruritus, unspecified: Secondary | ICD-10-CM | POA: Diagnosis not present

## 2020-04-10 DIAGNOSIS — J301 Allergic rhinitis due to pollen: Secondary | ICD-10-CM | POA: Diagnosis not present

## 2020-04-14 DIAGNOSIS — E119 Type 2 diabetes mellitus without complications: Secondary | ICD-10-CM | POA: Diagnosis not present

## 2020-04-15 DIAGNOSIS — R351 Nocturia: Secondary | ICD-10-CM | POA: Diagnosis not present

## 2020-04-15 DIAGNOSIS — R3915 Urgency of urination: Secondary | ICD-10-CM | POA: Diagnosis not present

## 2020-04-15 DIAGNOSIS — R350 Frequency of micturition: Secondary | ICD-10-CM | POA: Diagnosis not present

## 2020-04-22 DIAGNOSIS — R350 Frequency of micturition: Secondary | ICD-10-CM | POA: Diagnosis not present

## 2020-04-22 DIAGNOSIS — R3915 Urgency of urination: Secondary | ICD-10-CM | POA: Diagnosis not present

## 2020-04-29 DIAGNOSIS — R350 Frequency of micturition: Secondary | ICD-10-CM | POA: Diagnosis not present

## 2020-04-29 DIAGNOSIS — R3915 Urgency of urination: Secondary | ICD-10-CM | POA: Diagnosis not present

## 2020-05-06 DIAGNOSIS — I2510 Atherosclerotic heart disease of native coronary artery without angina pectoris: Secondary | ICD-10-CM | POA: Diagnosis not present

## 2020-05-06 DIAGNOSIS — R350 Frequency of micturition: Secondary | ICD-10-CM | POA: Diagnosis not present

## 2020-05-06 DIAGNOSIS — J4520 Mild intermittent asthma, uncomplicated: Secondary | ICD-10-CM | POA: Diagnosis not present

## 2020-05-06 DIAGNOSIS — R3915 Urgency of urination: Secondary | ICD-10-CM | POA: Diagnosis not present

## 2020-05-06 DIAGNOSIS — K219 Gastro-esophageal reflux disease without esophagitis: Secondary | ICD-10-CM | POA: Diagnosis not present

## 2020-05-06 DIAGNOSIS — M1711 Unilateral primary osteoarthritis, right knee: Secondary | ICD-10-CM | POA: Diagnosis not present

## 2020-05-06 DIAGNOSIS — E782 Mixed hyperlipidemia: Secondary | ICD-10-CM | POA: Diagnosis not present

## 2020-05-06 DIAGNOSIS — J45901 Unspecified asthma with (acute) exacerbation: Secondary | ICD-10-CM | POA: Diagnosis not present

## 2020-05-06 DIAGNOSIS — E7800 Pure hypercholesterolemia, unspecified: Secondary | ICD-10-CM | POA: Diagnosis not present

## 2020-05-06 DIAGNOSIS — E119 Type 2 diabetes mellitus without complications: Secondary | ICD-10-CM | POA: Diagnosis not present

## 2020-05-06 DIAGNOSIS — I10 Essential (primary) hypertension: Secondary | ICD-10-CM | POA: Diagnosis not present

## 2020-05-06 DIAGNOSIS — J45909 Unspecified asthma, uncomplicated: Secondary | ICD-10-CM | POA: Diagnosis not present

## 2020-05-07 DIAGNOSIS — Z23 Encounter for immunization: Secondary | ICD-10-CM | POA: Diagnosis not present

## 2020-05-13 DIAGNOSIS — R3915 Urgency of urination: Secondary | ICD-10-CM | POA: Diagnosis not present

## 2020-05-13 DIAGNOSIS — R350 Frequency of micturition: Secondary | ICD-10-CM | POA: Diagnosis not present

## 2020-05-15 DIAGNOSIS — E119 Type 2 diabetes mellitus without complications: Secondary | ICD-10-CM | POA: Diagnosis not present

## 2020-05-20 DIAGNOSIS — I10 Essential (primary) hypertension: Secondary | ICD-10-CM | POA: Diagnosis not present

## 2020-05-20 DIAGNOSIS — I2510 Atherosclerotic heart disease of native coronary artery without angina pectoris: Secondary | ICD-10-CM | POA: Diagnosis not present

## 2020-05-20 DIAGNOSIS — K219 Gastro-esophageal reflux disease without esophagitis: Secondary | ICD-10-CM | POA: Diagnosis not present

## 2020-05-20 DIAGNOSIS — Z7984 Long term (current) use of oral hypoglycemic drugs: Secondary | ICD-10-CM | POA: Diagnosis not present

## 2020-05-20 DIAGNOSIS — E782 Mixed hyperlipidemia: Secondary | ICD-10-CM | POA: Diagnosis not present

## 2020-05-20 DIAGNOSIS — L853 Xerosis cutis: Secondary | ICD-10-CM | POA: Diagnosis not present

## 2020-05-20 DIAGNOSIS — E119 Type 2 diabetes mellitus without complications: Secondary | ICD-10-CM | POA: Diagnosis not present

## 2020-05-20 DIAGNOSIS — R413 Other amnesia: Secondary | ICD-10-CM | POA: Diagnosis not present

## 2020-05-20 DIAGNOSIS — R350 Frequency of micturition: Secondary | ICD-10-CM | POA: Diagnosis not present

## 2020-05-27 DIAGNOSIS — E7800 Pure hypercholesterolemia, unspecified: Secondary | ICD-10-CM | POA: Diagnosis not present

## 2020-05-27 DIAGNOSIS — J4520 Mild intermittent asthma, uncomplicated: Secondary | ICD-10-CM | POA: Diagnosis not present

## 2020-05-27 DIAGNOSIS — I2510 Atherosclerotic heart disease of native coronary artery without angina pectoris: Secondary | ICD-10-CM | POA: Diagnosis not present

## 2020-05-27 DIAGNOSIS — E782 Mixed hyperlipidemia: Secondary | ICD-10-CM | POA: Diagnosis not present

## 2020-05-27 DIAGNOSIS — M1711 Unilateral primary osteoarthritis, right knee: Secondary | ICD-10-CM | POA: Diagnosis not present

## 2020-05-27 DIAGNOSIS — J45901 Unspecified asthma with (acute) exacerbation: Secondary | ICD-10-CM | POA: Diagnosis not present

## 2020-05-27 DIAGNOSIS — Z8546 Personal history of malignant neoplasm of prostate: Secondary | ICD-10-CM | POA: Diagnosis not present

## 2020-05-27 DIAGNOSIS — R350 Frequency of micturition: Secondary | ICD-10-CM | POA: Diagnosis not present

## 2020-05-27 DIAGNOSIS — C61 Malignant neoplasm of prostate: Secondary | ICD-10-CM | POA: Diagnosis not present

## 2020-05-27 DIAGNOSIS — I10 Essential (primary) hypertension: Secondary | ICD-10-CM | POA: Diagnosis not present

## 2020-05-27 DIAGNOSIS — K219 Gastro-esophageal reflux disease without esophagitis: Secondary | ICD-10-CM | POA: Diagnosis not present

## 2020-05-27 DIAGNOSIS — E119 Type 2 diabetes mellitus without complications: Secondary | ICD-10-CM | POA: Diagnosis not present

## 2020-05-27 DIAGNOSIS — R351 Nocturia: Secondary | ICD-10-CM | POA: Diagnosis not present

## 2020-06-05 ENCOUNTER — Encounter (INDEPENDENT_AMBULATORY_CARE_PROVIDER_SITE_OTHER): Payer: Medicare Other | Admitting: Ophthalmology

## 2020-06-06 DIAGNOSIS — H8110 Benign paroxysmal vertigo, unspecified ear: Secondary | ICD-10-CM | POA: Diagnosis not present

## 2020-06-06 DIAGNOSIS — N3281 Overactive bladder: Secondary | ICD-10-CM | POA: Diagnosis not present

## 2020-06-13 DIAGNOSIS — E119 Type 2 diabetes mellitus without complications: Secondary | ICD-10-CM | POA: Diagnosis not present

## 2020-06-17 ENCOUNTER — Encounter (INDEPENDENT_AMBULATORY_CARE_PROVIDER_SITE_OTHER): Payer: Medicare Other | Admitting: Ophthalmology

## 2020-06-18 DIAGNOSIS — Z85828 Personal history of other malignant neoplasm of skin: Secondary | ICD-10-CM | POA: Diagnosis not present

## 2020-06-18 DIAGNOSIS — L570 Actinic keratosis: Secondary | ICD-10-CM | POA: Diagnosis not present

## 2020-06-18 DIAGNOSIS — D0439 Carcinoma in situ of skin of other parts of face: Secondary | ICD-10-CM | POA: Diagnosis not present

## 2020-06-18 DIAGNOSIS — D485 Neoplasm of uncertain behavior of skin: Secondary | ICD-10-CM | POA: Diagnosis not present

## 2020-06-26 ENCOUNTER — Ambulatory Visit (INDEPENDENT_AMBULATORY_CARE_PROVIDER_SITE_OTHER): Payer: Medicare Other | Admitting: Ophthalmology

## 2020-06-26 ENCOUNTER — Other Ambulatory Visit: Payer: Self-pay

## 2020-06-26 ENCOUNTER — Encounter (INDEPENDENT_AMBULATORY_CARE_PROVIDER_SITE_OTHER): Payer: Self-pay | Admitting: Ophthalmology

## 2020-06-26 DIAGNOSIS — E119 Type 2 diabetes mellitus without complications: Secondary | ICD-10-CM

## 2020-06-26 DIAGNOSIS — G4733 Obstructive sleep apnea (adult) (pediatric): Secondary | ICD-10-CM

## 2020-06-26 DIAGNOSIS — H34212 Partial retinal artery occlusion, left eye: Secondary | ICD-10-CM

## 2020-06-26 NOTE — Progress Notes (Signed)
06/26/2020     CHIEF COMPLAINT Patient presents for Retina Follow Up (1 year fu ou and OCT/Pt states VA OU stable since last visit. Pt denies FOL, floaters, or ocular pain OU. /A1C:Unknown/LBS: 090 )   HISTORY OF PRESENT ILLNESS: [name] is a 87 y.o. male who presents to the clinic today for:   HPI    Retina Follow Up    Diagnosis: Other   Laterality: both eyes   Onset: 1 year ago   Severity: mild   Duration: 1 year   Course: stable   Comments: 1 year fu ou and OCT Pt states VA OU stable since last visit. Pt denies FOL, floaters, or ocular pain OU.  A1C:Unknown LBS: 090        Last edited by Kendra Opitz, COA on 06/26/2020 10:12 AM. (History)      Referring physician: Josetta Huddle, MD [address] [place],  [place] [address]  HISTORICAL INFORMATION:   Selected notes from the MEDICAL RECORD NUMBER    Lab Results  Component Value Date   HGBA1C 6.2 (H) 09/30/2011     CURRENT MEDICATIONS: No current outpatient medications on file. (Ophthalmic Drugs)   No current facility-administered medications for this visit. (Ophthalmic Drugs)   Current Outpatient Medications (Other)  Medication Sig  . albuterol (PROVENTIL HFA;VENTOLIN HFA) 108 (90 BASE) MCG/ACT inhaler Inhale 2 puffs into the lungs every 6 (six) hours as needed. For shortness of breath.  Marland Kitchen aspirin EC 81 MG tablet Take 81 mg by mouth daily.  Marland Kitchen atorvastatin (LIPITOR) 40 MG tablet Take 40 mg by mouth daily.  . Blood Glucose Monitoring Suppl ([place]) w/Device KIT [id number]  . Cholecalciferol (VITAMIN D-1000 MAX ST PO) Take 3,000 mg by mouth.  . Coenzyme Q10 (COQ10) 400 MG CAPS Take by mouth.  . esomeprazole (NEXIUM) 40 MG capsule TAKE 1 CAPSULE BY MOUTH  DAILY IN THE MORNING  . famotidine (PEPCID) 20 MG tablet Take 2 tablets every evening as directed (Patient taking differently: Take 20 mg by mouth at bedtime. Take 2 tablets every evening as directed)  . fexofenadine  (ALLEGRA) 180 MG tablet Take 180 mg by mouth. Takes 2 tablets in the morning, 1 tablet 6 hours later, then 2 tablets 8 hours later.  Marland Kitchen FLOVENT HFA 110 MCG/ACT inhaler   . IGLUCOSE TEST STRIPS test strip [id number]  . Lancets (STERILANCE TL) [place] [id number]  . metFORMIN (GLUCOPHAGE) 500 MG tablet Take 500-1,000 mg by mouth at bedtime. Take 500 mg every morning Take 1000 mg at bedtime  . mometasone-formoterol (DULERA) 200-5 MCG/ACT AERO Inhale 2 puffs twice daily with spacer to prevent cough/wheeze. Rinse, gargle and spit after each use.  . vitamin B-12 (CYANOCOBALAMIN) 1000 MCG tablet Take 3,000 mcg by mouth daily.   No current facility-administered medications for this visit. (Other)      REVIEW OF SYSTEMS:    ALLERGIES Allergies  Allergen Reactions  . Lisinopril Cough  . Sulfa Antibiotics Hives    PAST MEDICAL HISTORY Past Medical History:  Diagnosis Date  . Asthma   . Cancer [facility])    prostate with seeds  . Coronary artery disease   . Diabetes mellitus   . High cholesterol    Past Surgical History:  Procedure Laterality Date  . CATARACT EXTRACTION W/PHACO Right 1998   Dr. Karna Christmas  . CATARACT EXTRACTION W/PHACO Left 2003   Dr. Davonna Belling  . CATARACT EXTRACTION, BILATERAL  1998, 2002  . CORONARY ARTERY BYPASS  GRAFT    . HERNIA REPAIR    . INSERTION PROSTATE RADIATION SEED    . LEG SURGERY      FAMILY HISTORY Family History  Problem Relation Age of Onset  . Heart attack Mother   . Prostate cancer Father   . Colon cancer Paternal Grandfather   . Allergic rhinitis Neg Hx   . Angioedema Neg Hx   . Asthma Neg Hx   . Atopy Neg Hx   . Eczema Neg Hx   . Immunodeficiency Neg Hx   . Urticaria Neg Hx     SOCIAL HISTORY Social History   Tobacco Use  . Smoking status: Never Smoker  . Smokeless tobacco: Never Used  Vaping Use  . Vaping Use: Never used  Substance Use Topics  . Alcohol use: Yes    Alcohol/week: 10.0 standard drinks    Types: 10 Standard drinks or equivalent  per week    Comment: 1-2 drinks 4-5 days per week  . Drug use: No         OPHTHALMIC EXAM:  Base Eye Exam    Visual Acuity (ETDRS)      Right Left   Dist cc 20/20 -2 20/20 -1   Correction: Glasses       Tonometry (Tonopen, 10:16 AM)      Right Left   Pressure 15 13       Pupils      Pupils Dark Light Shape React APD   Right PERRL 4 3 Round Brisk None   Left PERRL 4 3 Round Brisk None       Visual Fields (Counting fingers)      Left Right    Full Full       Extraocular Movement      Right Left    Full Full       Neuro/Psych    Oriented x3: Yes   Mood/Affect: Normal       Dilation    Both eyes: 1.0% Mydriacyl, 2.5% Phenylephrine @ 10:16 AM        Slit Lamp and Fundus Exam    External Exam      Right Left   External Normal Normal       Slit Lamp Exam      Right Left   Lids/Lashes Normal Normal   Conjunctiva/Sclera White and quiet White and quiet   Cornea Clear Clear   Anterior Chamber Deep and quiet Deep and quiet   Iris Round and reactive Round and reactive   Lens Posterior chamber intraocular lens Posterior chamber intraocular lens, Open posterior capsule   Anterior Vitreous Normal Normal       Fundus Exam      Right Left   Posterior Vitreous Posterior vitreous detachment Posterior vitreous detachment   Disc Normal Normal   C/D Ratio 0.15 0.15   Macula Normal Normal   Vessels Normal Normal,,    Periphery Normal Normal          IMAGING AND PROCEDURES  Imaging and Procedures for 06/26/20  OCT, Retina - OU - Both Eyes       Right Eye Quality was good. Scan locations included subfoveal. Central Foveal Thickness: 260. Progression has been stable. Findings include normal foveal contour.   Left Eye Quality was good. Scan locations included subfoveal. Central Foveal Thickness: 265. Progression has been stable. Findings include normal foveal contour.                 ASSESSMENT/PLAN:  Cholesterol  retinal embolus of left  eye All micro calcific embolus in the perimacular region nasal to the fovea in a small cilioretinal artery, no progression no evidence of macular or retinal atrophy by OCT  Diabetes mellitus without complication (HCC) No detectable diabetic retinopathy The patient has diabetes without any evidence of retinopathy. The patient advised to maintain good blood glucose control, excellent blood pressure control, and favorable levels of cholesterol, low density lipoprotein, and high density lipoproteins. Follow up in 1 year was recommended. Explained that fluctuations in visual acuity , or "out of focus", may result from large variations of blood sugar control.  Obstructive sleep apnea hypopnea, moderate No detectable maculopathy      ICD-10-CM   1. Cholesterol retinal embolus of left eye  H34.212 OCT, Retina - OU - Both Eyes  2. Diabetes mellitus without complication ([place])  Z76.7   3. Obstructive sleep apnea hypopnea, moderate  G47.33     1.  No detectable diabetic retinopathy in either eye.  2.  Patient continues on on CPAP  3.  Ophthalmic Meds Ordered this visit:  No orders of the defined types were placed in this encounter.      Return in about 1 year (around 06/26/2021) for DILATE OU, COLOR FP, OCT.  There are no Patient Instructions on file for this visit.   Explained the diagnoses, plan, and follow up with the patient and they expressed understanding.  Patient expressed understanding of the importance of proper follow up care.   Clent Demark Helia Haese M.D. Diseases & Surgery of the Retina and Vitreous Retina & Diabetic [place] 06/26/20     Abbreviations: M myopia (nearsighted); A astigmatism; H hyperopia (farsighted); P presbyopia; Mrx spectacle prescription;  CTL contact lenses; OD right eye; OS left eye; OU both eyes  XT exotropia; ET esotropia; PEK punctate epithelial keratitis; PEE punctate epithelial erosions; DES dry eye syndrome; MGD meibomian gland dysfunction; ATs  artificial tears; PFAT's preservative free artificial tears; [place] nuclear sclerotic cataract; PSC posterior subcapsular cataract; ERM epi-retinal membrane; PVD posterior vitreous detachment; RD retinal detachment; DM diabetes mellitus; DR diabetic retinopathy; NPDR non-proliferative diabetic retinopathy; PDR proliferative diabetic retinopathy; CSME clinically significant macular edema; DME diabetic macular edema; dbh dot blot hemorrhages; CWS cotton wool spot; POAG primary open angle glaucoma; C/D cup-to-disc ratio; HVF humphrey visual field; GVF goldmann visual field; OCT optical coherence tomography; IOP intraocular pressure; BRVO Branch retinal vein occlusion; CRVO central retinal vein occlusion; CRAO central retinal artery occlusion; BRAO branch retinal artery occlusion; RT retinal tear; SB scleral buckle; PPV pars plana vitrectomy; VH Vitreous hemorrhage; PRP panretinal laser photocoagulation; IVK intravitreal kenalog; VMT vitreomacular traction; MH Macular hole;  NVD neovascularization of the disc; NVE neovascularization elsewhere; AREDS age related eye disease study; ARMD age related macular degeneration; POAG primary open angle glaucoma; EBMD epithelial/anterior basement membrane dystrophy; ACIOL anterior chamber intraocular lens; IOL intraocular lens; PCIOL posterior chamber intraocular lens; Phaco/IOL phacoemulsification with intraocular lens placement; [place] photorefractive keratectomy; LASIK laser assisted in situ keratomileusis; HTN hypertension; DM diabetes mellitus; COPD chronic obstructive pulmonary disease

## 2020-06-26 NOTE — Assessment & Plan Note (Signed)
No detectable maculopathy

## 2020-06-26 NOTE — Assessment & Plan Note (Signed)
No detectable diabetic retinopathy  The patient has diabetes without any evidence of retinopathy. The patient advised to maintain good blood glucose control, excellent blood pressure control, and favorable levels of cholesterol, low density lipoprotein, and high density lipoproteins. Follow up in 1 year was recommended. Explained that fluctuations in visual acuity , or "out of focus", may result from large variations of blood sugar control. 

## 2020-06-26 NOTE — Assessment & Plan Note (Signed)
All micro calcific embolus in the perimacular region nasal to the fovea in a small cilioretinal artery, no progression no evidence of macular or retinal atrophy by OCT

## 2020-07-15 DIAGNOSIS — E119 Type 2 diabetes mellitus without complications: Secondary | ICD-10-CM | POA: Diagnosis not present

## 2020-07-17 DIAGNOSIS — C61 Malignant neoplasm of prostate: Secondary | ICD-10-CM | POA: Diagnosis not present

## 2020-07-17 DIAGNOSIS — R351 Nocturia: Secondary | ICD-10-CM | POA: Diagnosis not present

## 2020-07-24 DIAGNOSIS — S51811A Laceration without foreign body of right forearm, initial encounter: Secondary | ICD-10-CM | POA: Diagnosis not present

## 2020-07-24 DIAGNOSIS — W228XXA Striking against or struck by other objects, initial encounter: Secondary | ICD-10-CM | POA: Diagnosis not present

## 2020-07-30 DIAGNOSIS — L98499 Non-pressure chronic ulcer of skin of other sites with unspecified severity: Secondary | ICD-10-CM | POA: Diagnosis not present

## 2020-07-30 DIAGNOSIS — Z85828 Personal history of other malignant neoplasm of skin: Secondary | ICD-10-CM | POA: Diagnosis not present

## 2020-08-07 DIAGNOSIS — Z85828 Personal history of other malignant neoplasm of skin: Secondary | ICD-10-CM | POA: Diagnosis not present

## 2020-08-07 DIAGNOSIS — L98499 Non-pressure chronic ulcer of skin of other sites with unspecified severity: Secondary | ICD-10-CM | POA: Diagnosis not present

## 2020-08-14 DIAGNOSIS — E119 Type 2 diabetes mellitus without complications: Secondary | ICD-10-CM | POA: Diagnosis not present

## 2020-08-20 DIAGNOSIS — E7800 Pure hypercholesterolemia, unspecified: Secondary | ICD-10-CM | POA: Diagnosis not present

## 2020-08-20 DIAGNOSIS — J45901 Unspecified asthma with (acute) exacerbation: Secondary | ICD-10-CM | POA: Diagnosis not present

## 2020-08-20 DIAGNOSIS — E782 Mixed hyperlipidemia: Secondary | ICD-10-CM | POA: Diagnosis not present

## 2020-08-20 DIAGNOSIS — C61 Malignant neoplasm of prostate: Secondary | ICD-10-CM | POA: Diagnosis not present

## 2020-08-20 DIAGNOSIS — K219 Gastro-esophageal reflux disease without esophagitis: Secondary | ICD-10-CM | POA: Diagnosis not present

## 2020-08-20 DIAGNOSIS — J4520 Mild intermittent asthma, uncomplicated: Secondary | ICD-10-CM | POA: Diagnosis not present

## 2020-08-20 DIAGNOSIS — Z8546 Personal history of malignant neoplasm of prostate: Secondary | ICD-10-CM | POA: Diagnosis not present

## 2020-08-20 DIAGNOSIS — E119 Type 2 diabetes mellitus without complications: Secondary | ICD-10-CM | POA: Diagnosis not present

## 2020-08-20 DIAGNOSIS — J45909 Unspecified asthma, uncomplicated: Secondary | ICD-10-CM | POA: Diagnosis not present

## 2020-08-20 DIAGNOSIS — M1711 Unilateral primary osteoarthritis, right knee: Secondary | ICD-10-CM | POA: Diagnosis not present

## 2020-08-20 DIAGNOSIS — I10 Essential (primary) hypertension: Secondary | ICD-10-CM | POA: Diagnosis not present

## 2020-08-20 DIAGNOSIS — I2510 Atherosclerotic heart disease of native coronary artery without angina pectoris: Secondary | ICD-10-CM | POA: Diagnosis not present

## 2020-08-21 DIAGNOSIS — Z20822 Contact with and (suspected) exposure to covid-19: Secondary | ICD-10-CM | POA: Diagnosis not present

## 2020-09-11 DIAGNOSIS — E119 Type 2 diabetes mellitus without complications: Secondary | ICD-10-CM | POA: Diagnosis not present

## 2020-10-15 DIAGNOSIS — L309 Dermatitis, unspecified: Secondary | ICD-10-CM | POA: Diagnosis not present

## 2020-10-15 DIAGNOSIS — D692 Other nonthrombocytopenic purpura: Secondary | ICD-10-CM | POA: Diagnosis not present

## 2020-10-15 DIAGNOSIS — Z85828 Personal history of other malignant neoplasm of skin: Secondary | ICD-10-CM | POA: Diagnosis not present

## 2020-10-22 DIAGNOSIS — R0789 Other chest pain: Secondary | ICD-10-CM | POA: Diagnosis not present

## 2020-10-22 DIAGNOSIS — R350 Frequency of micturition: Secondary | ICD-10-CM | POA: Diagnosis not present

## 2020-10-22 DIAGNOSIS — R600 Localized edema: Secondary | ICD-10-CM | POA: Diagnosis not present

## 2020-10-24 DIAGNOSIS — C61 Malignant neoplasm of prostate: Secondary | ICD-10-CM | POA: Diagnosis not present

## 2020-10-30 DIAGNOSIS — R3915 Urgency of urination: Secondary | ICD-10-CM | POA: Diagnosis not present

## 2020-10-30 DIAGNOSIS — C61 Malignant neoplasm of prostate: Secondary | ICD-10-CM | POA: Diagnosis not present

## 2020-10-30 DIAGNOSIS — R351 Nocturia: Secondary | ICD-10-CM | POA: Diagnosis not present

## 2020-10-30 DIAGNOSIS — R350 Frequency of micturition: Secondary | ICD-10-CM | POA: Diagnosis not present

## 2020-11-03 DIAGNOSIS — Z7189 Other specified counseling: Secondary | ICD-10-CM | POA: Diagnosis not present

## 2020-11-03 DIAGNOSIS — G4733 Obstructive sleep apnea (adult) (pediatric): Secondary | ICD-10-CM | POA: Diagnosis not present

## 2020-11-03 DIAGNOSIS — Z23 Encounter for immunization: Secondary | ICD-10-CM | POA: Diagnosis not present

## 2020-11-03 DIAGNOSIS — E559 Vitamin D deficiency, unspecified: Secondary | ICD-10-CM | POA: Diagnosis not present

## 2020-11-03 DIAGNOSIS — E119 Type 2 diabetes mellitus without complications: Secondary | ICD-10-CM | POA: Diagnosis not present

## 2020-11-03 DIAGNOSIS — I10 Essential (primary) hypertension: Secondary | ICD-10-CM | POA: Diagnosis not present

## 2020-11-03 DIAGNOSIS — R600 Localized edema: Secondary | ICD-10-CM | POA: Diagnosis not present

## 2020-11-03 DIAGNOSIS — Z7984 Long term (current) use of oral hypoglycemic drugs: Secondary | ICD-10-CM | POA: Diagnosis not present

## 2020-11-03 DIAGNOSIS — R413 Other amnesia: Secondary | ICD-10-CM | POA: Diagnosis not present

## 2020-11-03 DIAGNOSIS — R350 Frequency of micturition: Secondary | ICD-10-CM | POA: Diagnosis not present

## 2020-11-03 DIAGNOSIS — Z1389 Encounter for screening for other disorder: Secondary | ICD-10-CM | POA: Diagnosis not present

## 2020-11-03 DIAGNOSIS — R0789 Other chest pain: Secondary | ICD-10-CM | POA: Diagnosis not present

## 2020-11-03 DIAGNOSIS — I2510 Atherosclerotic heart disease of native coronary artery without angina pectoris: Secondary | ICD-10-CM | POA: Diagnosis not present

## 2020-11-03 DIAGNOSIS — E782 Mixed hyperlipidemia: Secondary | ICD-10-CM | POA: Diagnosis not present

## 2020-11-03 DIAGNOSIS — Z0000 Encounter for general adult medical examination without abnormal findings: Secondary | ICD-10-CM | POA: Diagnosis not present

## 2020-11-07 DIAGNOSIS — Z23 Encounter for immunization: Secondary | ICD-10-CM | POA: Diagnosis not present

## 2020-12-30 DIAGNOSIS — Z20828 Contact with and (suspected) exposure to other viral communicable diseases: Secondary | ICD-10-CM | POA: Diagnosis not present

## 2021-01-07 DIAGNOSIS — G4733 Obstructive sleep apnea (adult) (pediatric): Secondary | ICD-10-CM | POA: Diagnosis not present

## 2021-01-13 DIAGNOSIS — J4540 Moderate persistent asthma, uncomplicated: Secondary | ICD-10-CM | POA: Diagnosis not present

## 2021-01-13 DIAGNOSIS — J3089 Other allergic rhinitis: Secondary | ICD-10-CM | POA: Diagnosis not present

## 2021-01-13 DIAGNOSIS — L299 Pruritus, unspecified: Secondary | ICD-10-CM | POA: Diagnosis not present

## 2021-01-13 DIAGNOSIS — J301 Allergic rhinitis due to pollen: Secondary | ICD-10-CM | POA: Diagnosis not present

## 2021-01-14 DIAGNOSIS — E119 Type 2 diabetes mellitus without complications: Secondary | ICD-10-CM | POA: Diagnosis not present

## 2021-01-20 DIAGNOSIS — I2510 Atherosclerotic heart disease of native coronary artery without angina pectoris: Secondary | ICD-10-CM | POA: Diagnosis not present

## 2021-01-20 DIAGNOSIS — R0789 Other chest pain: Secondary | ICD-10-CM | POA: Diagnosis not present

## 2021-01-20 DIAGNOSIS — K219 Gastro-esophageal reflux disease without esophagitis: Secondary | ICD-10-CM | POA: Diagnosis not present

## 2021-01-20 DIAGNOSIS — G4733 Obstructive sleep apnea (adult) (pediatric): Secondary | ICD-10-CM | POA: Diagnosis not present

## 2021-01-20 DIAGNOSIS — E559 Vitamin D deficiency, unspecified: Secondary | ICD-10-CM | POA: Diagnosis not present

## 2021-01-20 DIAGNOSIS — L570 Actinic keratosis: Secondary | ICD-10-CM | POA: Diagnosis not present

## 2021-01-20 DIAGNOSIS — E782 Mixed hyperlipidemia: Secondary | ICD-10-CM | POA: Diagnosis not present

## 2021-01-20 DIAGNOSIS — R413 Other amnesia: Secondary | ICD-10-CM | POA: Diagnosis not present

## 2021-01-20 DIAGNOSIS — R350 Frequency of micturition: Secondary | ICD-10-CM | POA: Diagnosis not present

## 2021-01-20 DIAGNOSIS — L853 Xerosis cutis: Secondary | ICD-10-CM | POA: Diagnosis not present

## 2021-01-20 DIAGNOSIS — I10 Essential (primary) hypertension: Secondary | ICD-10-CM | POA: Diagnosis not present

## 2021-01-20 DIAGNOSIS — R600 Localized edema: Secondary | ICD-10-CM | POA: Diagnosis not present

## 2021-03-17 DIAGNOSIS — Z961 Presence of intraocular lens: Secondary | ICD-10-CM | POA: Diagnosis not present

## 2021-03-17 DIAGNOSIS — H18832 Recurrent erosion of cornea, left eye: Secondary | ICD-10-CM | POA: Diagnosis not present

## 2021-03-17 DIAGNOSIS — H18592 Other hereditary corneal dystrophies, left eye: Secondary | ICD-10-CM | POA: Diagnosis not present

## 2021-03-26 ENCOUNTER — Other Ambulatory Visit: Payer: Self-pay

## 2021-03-26 ENCOUNTER — Ambulatory Visit (INDEPENDENT_AMBULATORY_CARE_PROVIDER_SITE_OTHER): Payer: Medicare Other | Admitting: Ophthalmology

## 2021-03-26 ENCOUNTER — Encounter (INDEPENDENT_AMBULATORY_CARE_PROVIDER_SITE_OTHER): Payer: Self-pay | Admitting: Ophthalmology

## 2021-03-26 DIAGNOSIS — H43813 Vitreous degeneration, bilateral: Secondary | ICD-10-CM | POA: Diagnosis not present

## 2021-03-26 DIAGNOSIS — H34212 Partial retinal artery occlusion, left eye: Secondary | ICD-10-CM | POA: Diagnosis not present

## 2021-03-26 DIAGNOSIS — H43312 Vitreous membranes and strands, left eye: Secondary | ICD-10-CM | POA: Diagnosis not present

## 2021-03-26 NOTE — Assessment & Plan Note (Signed)
Progressive posterior PVD now likely peripheral PVD release triggering new onset of symptoms with vitreous membranes and strands of the left eye however no retinal holes or tears carefully in the left eye with multimodality examination including multiple lenses, scleral depression as well as finally B-scan ultrasonography of the retinal periphery in each quadrant each and every quadrant

## 2021-03-26 NOTE — Assessment & Plan Note (Signed)
Vitreous membranes and strands are seen on clinical examination however there is no pigmentary deposits in the posterior segment or in the anterior segment, anterior vitreal still clear.  Nonetheless this likely represents symptomatic progression of peripheral posterior vitreous detachment, associated not with retinal holes or tears today as could be seen on examination but also confirmed with B-scan ultrasound capnography

## 2021-03-26 NOTE — Progress Notes (Signed)
03/26/2021     CHIEF COMPLAINT Patient presents for  Chief Complaint  Patient presents with   Eye Problem      HISTORY OF PRESENT ILLNESS: [name] is a 88 y.o. male who presents to the clinic today for:   HPI   WIP- floaters poss hemorrhage OS.  Pt states "I had a sudden onset of 3 black floaters, all 3 linked together. Sunday night it started. It has changed since then, they look different now. Yesterday they were a huge swarm of uniformly round cell in the afternoon. By bed time they were just about all gone. This morning there are just a few pale cells and other dark ones. Central vision has always been clear, yesterday afternoon it was a big cloud. The cloud is pretty much gone, it has been doing this off and on since Sunday night." Pt states "I have been using an eye lubricating ointment." Pt denies flashes of light. Last edited by Laurin Coder on 03/26/2021  9:08 AM.      Referring physician: Josetta Huddle, MD [address] [place],  [place] [address]  HISTORICAL INFORMATION:   Selected notes from the MEDICAL RECORD NUMBER    Lab Results  Component Value Date   HGBA1C 6.2 (H) 09/30/2011     CURRENT MEDICATIONS: No current outpatient medications on file. (Ophthalmic Drugs)   No current facility-administered medications for this visit. (Ophthalmic Drugs)   Current Outpatient Medications (Other)  Medication Sig   albuterol (PROVENTIL HFA;VENTOLIN HFA) 108 (90 BASE) MCG/ACT inhaler Inhale 2 puffs into the lungs every 6 (six) hours as needed. For shortness of breath.   aspirin EC 81 MG tablet Take 81 mg by mouth daily.   atorvastatin (LIPITOR) 40 MG tablet Take 40 mg by mouth daily.   Blood Glucose Monitoring Suppl ([place]) w/Device KIT [id number]   Cholecalciferol (VITAMIN D-1000 MAX ST PO) Take 3,000 mg by mouth.   Coenzyme Q10 (COQ10) 400 MG CAPS Take by mouth.   esomeprazole (NEXIUM) 40 MG capsule TAKE 1 CAPSULE BY MOUTH   DAILY IN THE MORNING   famotidine (PEPCID) 20 MG tablet Take 2 tablets every evening as directed (Patient taking differently: Take 20 mg by mouth at bedtime. Take 2 tablets every evening as directed)   fexofenadine (ALLEGRA) 180 MG tablet Take 180 mg by mouth. Takes 2 tablets in the morning, 1 tablet 6 hours later, then 2 tablets 8 hours later.   FLOVENT HFA 110 MCG/ACT inhaler    IGLUCOSE TEST STRIPS test strip [id number]   Lancets (STERILANCE TL) MISC [id number]   metFORMIN (GLUCOPHAGE) 500 MG tablet Take 500-1,000 mg by mouth at bedtime. Take 500 mg every morning Take 1000 mg at bedtime   mometasone-formoterol (DULERA) 200-5 MCG/ACT AERO Inhale 2 puffs twice daily with spacer to prevent cough/wheeze. Rinse, gargle and spit after each use.   vitamin B-12 (CYANOCOBALAMIN) 1000 MCG tablet Take 3,000 mcg by mouth daily.   No current facility-administered medications for this visit. (Other)      REVIEW OF SYSTEMS: ROS   Negative for: Constitutional, Gastrointestinal, Neurological, Skin, Genitourinary, Musculoskeletal, HENT, Endocrine, Cardiovascular, Eyes, Respiratory, Psychiatric, Allergic/Imm, Heme/Lymph Last edited by Hurman Horn, MD on 03/26/2021  9:16 AM.       ALLERGIES Allergies  Allergen Reactions   Lisinopril Cough   Sulfa Antibiotics Hives    PAST MEDICAL HISTORY Past Medical History:  Diagnosis Date   Asthma    Cancer ([place])  prostate with seeds   Coronary artery disease    Diabetes mellitus    High cholesterol    Past Surgical History:  Procedure Laterality Date   CATARACT EXTRACTION W/PHACO Right 1998   Dr. Karna Christmas   CATARACT EXTRACTION W/PHACO Left 2003   Dr. Davonna Belling   CATARACT EXTRACTION, BILATERAL  1998, 2002   CORONARY ARTERY BYPASS GRAFT     HERNIA REPAIR     INSERTION PROSTATE RADIATION SEED     LEG SURGERY      FAMILY HISTORY Family History  Problem Relation Age of Onset   Heart attack Mother    Prostate cancer Father    Colon cancer Paternal  Grandfather    Allergic rhinitis Neg Hx    Angioedema Neg Hx    Asthma Neg Hx    Atopy Neg Hx    Eczema Neg Hx    Immunodeficiency Neg Hx    Urticaria Neg Hx     SOCIAL HISTORY Social History   Tobacco Use   Smoking status: Never   Smokeless tobacco: Never  Vaping Use   Vaping Use: Never used  Substance Use Topics   Alcohol use: Yes    Alcohol/week: 10.0 standard drinks    Types: 10 Standard drinks or equivalent per week    Comment: 1-2 drinks 4-5 days per week   Drug use: No         OPHTHALMIC EXAM:  Base Eye Exam     Visual Acuity (ETDRS)       Right Left   Dist cc 20/20 20/20 -1    Correction: Glasses         Tonometry (Tonopen, 8:57 AM)       Right Left   Pressure 13 16         Pupils       Pupils Dark Light APD   Right PERRL 4 3 None   Left PERRL 4 3 None         Extraocular Movement       Right Left    Full Full         Neuro/Psych     Oriented x3: Yes   Mood/Affect: Normal         Dilation     Left eye: 1.0% Mydriacyl, 2.5% Phenylephrine @ 8:57 AM           Slit Lamp and Fundus Exam     External Exam       Right Left   External Normal Normal         Slit Lamp Exam       Right Left   Lids/Lashes Normal Normal   Conjunctiva/Sclera White and quiet White and quiet   Cornea Clear Clear   Anterior Chamber Deep and quiet Deep and quiet   Iris Round and reactive Round and reactive   Lens Posterior chamber intraocular lens Posterior chamber intraocular lens, Open posterior capsule intraorally   Anterior Vitreous Normal Normal, no pigment anteriorly         Fundus Exam       Right Left   Posterior Vitreous Posterior vitreous detachment Posterior vitreous detachment, vitreous membranes and strands, no pigment posteriorly no pigment anterior   Disc Normal Normal   C/D Ratio 0.15 0.15   Macula Normal Normal   Vessels Normal Normal,,    Periphery Normal Normal, no holes or tears, scleral depression 20 and 25  D exam, and peripherally with 90 D exam  IMAGING AND PROCEDURES  Imaging and Procedures for 03/26/21  OCT, Retina - OU - Both Eyes       Right Eye Central Foveal Thickness: 257. Progression has been stable.   Left Eye Central Foveal Thickness: 272. Progression has been stable.   Notes No active maculopathy, no topographic distortion OU     Color Fundus Photography Optos - OU - Both Eyes       Right Eye Progression has improved. Disc findings include normal observations. Macula : normal observations. Vessels : normal observations. Periphery : normal observations.   Left Eye Progression has improved. Disc findings include normal observations. Macula : normal observations. Vessels : normal observations. Periphery : normal observations.   Notes Posterior vitreous detachment OU no retinal holes or tears today  PVD OS seen in the past, now with more vitreous membranes and strands particularly inferiorly and seen well on infrared view     B-Scan Ultrasound - OS - Left Eye       Quality was good. Findings included vitreous opacities.   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 posterior PVD now likely peripheral PVD release triggering new onset of symptoms with vitreous membranes and strands of the left eye however no retinal holes or tears carefully in the left eye with multimodality examination including multiple lenses, scleral depression as well as finally B-scan ultrasonography of the retinal periphery in each quadrant each and every quadrant  Vitreous membranes or strands, left Vitreous membranes and strands are seen on clinical  examination however there is no pigmentary deposits in the posterior segment or in the anterior segment, anterior vitreal still clear.  Nonetheless this likely represents symptomatic progression of peripheral posterior vitreous detachment, associated not with retinal holes or tears today as could be seen on examination but also confirmed with B-scan ultrasound capnography     ICD-10-CM   1. Vitreous membranes or strands, left  H43.312 B-Scan Ultrasound - OS - Left Eye    2. Cholesterol retinal embolus of left eye  H34.212 OCT, Retina - OU - Both Eyes    Color Fundus Photography Optos - OU - Both Eyes    3. Posterior vitreous detachment of both eyes  H43.813       1.  New onset vitreous membranes and strands left eye, symptomatic, no retinal hole or tear on multimodality examination  2.  Patient understands clearly the need for follow-up promptly if a profound worsening of symptoms develop particularly sparks flashes of light or curtain of darkness were to develop  3.  Ophthalmic Meds Ordered this visit:  No orders of the defined types were placed in this encounter.      Return in about 4 weeks (around 04/23/2021) for dilate, OS, COLOR FP, OCT.  There are no Patient Instructions on file for this visit.   Explained the diagnoses, plan, and follow up with the patient and they expressed understanding.  Patient expressed understanding of the importance of proper follow up care.   Clent Demark Jermine Bibbee M.D. Diseases & Surgery of the Retina and Vitreous Retina & Diabetic [place] 03/26/21     Abbreviations: M myopia (nearsighted); A astigmatism; H hyperopia (farsighted); P presbyopia; Mrx spectacle prescription;  CTL contact lenses; OD right eye; OS left eye; OU both eyes  XT exotropia; ET esotropia; PEK punctate epithelial keratitis; PEE punctate epithelial erosions; DES dry eye syndrome; MGD meibomian gland dysfunction; ATs artificial tears; PFAT's preservative free artificial tears; [place]  nuclear sclerotic cataract; PSC posterior subcapsular cataract; ERM epi-retinal membrane; PVD posterior vitreous detachment; RD retinal detachment; DM diabetes mellitus; DR diabetic retinopathy; NPDR non-proliferative diabetic retinopathy; PDR proliferative diabetic retinopathy; CSME clinically significant macular edema; DME diabetic macular edema; dbh dot blot hemorrhages; CWS cotton wool spot; POAG primary open angle glaucoma; C/D cup-to-disc ratio; HVF humphrey visual field; GVF goldmann visual field; OCT optical coherence tomography; IOP intraocular pressure; BRVO Branch retinal vein occlusion; CRVO central retinal vein occlusion; CRAO central retinal artery occlusion; BRAO branch retinal artery occlusion; RT retinal tear; SB scleral buckle; PPV pars plana vitrectomy; VH Vitreous hemorrhage; PRP panretinal laser photocoagulation; IVK intravitreal kenalog; VMT vitreomacular traction; MH Macular hole;  NVD neovascularization of the disc; NVE neovascularization elsewhere; AREDS age related eye disease study; ARMD age related macular degeneration; POAG primary open angle glaucoma; EBMD epithelial/anterior basement membrane dystrophy; ACIOL anterior chamber intraocular lens; IOL intraocular lens; PCIOL posterior chamber intraocular lens; Phaco/IOL phacoemulsification with intraocular lens placement; [place] photorefractive keratectomy; LASIK laser assisted in situ keratomileusis; HTN hypertension; DM diabetes mellitus; COPD chronic obstructive pulmonary disease

## 2021-04-09 DIAGNOSIS — M533 Sacrococcygeal disorders, not elsewhere classified: Secondary | ICD-10-CM | POA: Diagnosis not present

## 2021-04-17 ENCOUNTER — Ambulatory Visit (HOSPITAL_BASED_OUTPATIENT_CLINIC_OR_DEPARTMENT_OTHER): Payer: Medicare Other | Attending: Internal Medicine | Admitting: Physical Therapy

## 2021-04-17 ENCOUNTER — Other Ambulatory Visit: Payer: Self-pay

## 2021-04-17 ENCOUNTER — Encounter (HOSPITAL_BASED_OUTPATIENT_CLINIC_OR_DEPARTMENT_OTHER): Payer: Self-pay | Admitting: Physical Therapy

## 2021-04-17 DIAGNOSIS — M533 Sacrococcygeal disorders, not elsewhere classified: Secondary | ICD-10-CM | POA: Diagnosis not present

## 2021-04-17 DIAGNOSIS — M62830 Muscle spasm of back: Secondary | ICD-10-CM

## 2021-04-17 NOTE — Therapy (Signed)
?OUTPATIENT PHYSICAL THERAPY THORACOLUMBAR EVALUATION ? ? ?Patient Name: [name] ?MRN: [id number] ?DOB:17-Nov-1932, 88 y.o., male ?Today's Date: 04/17/2021 ? ? PT End of Session - 04/17/21 1021   ? ? Visit Number 1   ? Number of Visits 7   ? Date for PT Re-Evaluation 05/08/21   ? Authorization Type MCR   ? Progress Note Due on Visit 10   ? PT Start Time 1019   ? PT Stop Time 1101   ? PT Time Calculation (min) 42 min   ? Activity Tolerance Patient tolerated treatment well   ? Behavior During Therapy [facility] for tasks assessed/performed   ? ?  ?  ? ?  ? ? ?Past Medical History:  ?Diagnosis Date  ? Asthma   ? 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 Coronary artery disease   ? Diabetes mellitus   ? High cholesterol   ? ?Past Surgical History:  ?Procedure Laterality Date  ? CATARACT EXTRACTION W/PHACO Right 1998  ? Dr. Karna Christmas  ? CATARACT EXTRACTION W/PHACO Left 2003  ? Dr. Davonna Belling  ? CATARACT EXTRACTION, BILATERAL  1998, 2002  ? CORONARY ARTERY BYPASS GRAFT    ? HERNIA REPAIR    ? INSERTION PROSTATE RADIATION SEED    ? LEG SURGERY    ? ?Patient Active Problem List  ? Diagnosis Date Noted  ? Vitreous membranes or strands, left 03/26/2021  ? Posterior vitreous detachment of both eyes 03/26/2021  ? Obstructive sleep apnea hypopnea, moderate 06/26/2020  ? Cholesterol retinal embolus of left eye 06/05/2019  ? Diabetes mellitus without complication ([place]) 40/09/6759  ? Extrinsic asthma with exacerbation 06/05/2019  ? Cough variant asthma vs uacs 10/06/2017  ? Hematuria 09/30/2011  ? Hyponatremia 09/30/2011  ? Acute on chronic renal failure ([place]) 09/30/2011  ? Leukocytosis 09/30/2011  ? Proteinuria 09/30/2011  ? ? ?PCP: Josetta Huddle, MD ? ?REFERRING PROVIDER: Kathalene Frames, * ? ?REFERRING DIAG: M53.3 (ICD-10-CM) - Sacrococcygeal disorders, not elsewhere classified ? ?THERAPY DIAG:  ?Sacrococcygeal disorders, not elsewhere classified ? ?Muscle spasm of back ? ?ONSET DATE: 4-6 weeks ago ? ?SUBJECTIVE:                                                                                                                                                                                           ? ?SUBJECTIVE STATEMENT: ?Started about 4-6 weeks ago of insidious onset. Rt hip became sore and getting into bed unless legs are together, getting out of a chair is painful. Thinking it would work myself out until last week when pain significantly increased. I got a theragun unit that makes  a huge difference. Points around Rt SIJ with soreness in immediate lateral and superior regions.  ? ?PERTINENT HISTORY:  ?H/o of hernia repair ? ?PAIN:  ?Are you having pain? Yes ?NPRS scale: moderate/10 ?Pain location: Rt SIJ ?Pain description: intermittent  ?Aggravating factors: AM pain, standing from chair ?Relieving factors: theragun, heat ? ?PRECAUTIONS: None ? ?WEIGHT BEARING RESTRICTIONS No ? ?FALLS:  ?Has patient fallen in last 6 months? No, Number of falls: 0; no fall but did bang my [place] the airport [place] and then it slammed into the knee- had to use a walker to get out of the airport ? ?LIVING ENVIRONMENT: ?Lives with: lives with their family ?Lives in: House/apartment ? ?OCCUPATION: retired ophthalmologist  ? ?PLOF: Independent ? ?PATIENT GOALS decrease pain, return to walking for exercise- was doing 1 mile at brisk pace ? ? ?OBJECTIVE:  ? ?COGNITION: ? Overall cognitive status: Within functional limits for tasks assessed   ?  ?SENSATION: ? Denies N/T  ? ? ?POSTURE:  ?Decreased thoracic kyphosis & lumbar lordosis ? ?PALPATION: ?Rt anterior innominate rotation ? ? ?LUMBARAROM/PROM ? ?A/PROM A/PROM  ?04/17/2021  ?Flexion   ?Extension   ?Right lateral flexion   ?Left lateral flexion   ?Right rotation   ?Left rotation   ? (Blank rows = not tested) ? ?LE AROM/PROM: ? ?A/PROM Right ?04/17/2021 Left ?04/17/2021  ?Hip flexion    ?Hip extension    ?Hip abduction    ?Hip adduction    ?Hip internal rotation    ?Hip external rotation     ?Knee flexion    ?Knee extension    ?Ankle dorsiflexion    ?Ankle plantarflexion    ?Ankle inversion    ?Ankle eversion    ? (Blank rows = not tested) ? ?LE MMT: unable to demo stand from chair without UEs due to pain ? ?MMT Right ?04/17/2021 Left ?04/17/2021  ?Hip flexion    ?Hip extension    ?Hip abduction    ?Hip adduction    ?Hip internal rotation    ?Hip external rotation    ?Knee flexion    ?Knee extension    ?Ankle dorsiflexion    ?Ankle plantarflexion    ?Ankle inversion    ?Ankle eversion    ? (Blank rows = not tested) ? ? ? ?GAIT: ? ?Comments: pt does not present with antalgic gait, slight forward flexion ? ? ? ?TODAY'S TREATMENT  ?EVAL: ?MANUAL: STM fibers of gluts and QL near the Rt SIJ, sustained PA pressure to Rt upper quadrant of sacrum 2x2 min ?THEREX: hooklying adduction with abdominal engagement ?  Mini bridge outside of pain ?    DKTC with deep breathing ?    Seated iso adduction in resting posture ? ? ?PATIENT EDUCATION:  ?Education details: Anatomy of condition, POC, HEP, exercise form/rationale ?Person educated: Patient ?Education method: Explanation, Demonstration, Tactile cues, Verbal cues, and Handouts ?Education comprehension: verbalized understanding, returned demonstration, verbal cues required, tactile cues required, and needs further education ? ? ?HOME EXERCISE PROGRAM: ?[id number] ? ?ASSESSMENT: ? ?CLINICAL IMPRESSION: ?Patient is a 88 y.o. M who was seen today for physical therapy evaluation and treatment for SIJ pain of acute onset. Significant tightness and trigger points surrounding the SIJ creating excessive compression and compensation that has lead to innominate rotation. Pt verbalized concordant pain upon palpation to trigger points and was able to feel a difference following. Pt will benefit from skilled PT to decrease myofascial restrictions and retrain proper  activation patterns to meet functional goals..   ? ? ?OBJECTIVE IMPAIRMENTS decreased activity tolerance, difficulty  walking, decreased strength, increased muscle spasms, impaired flexibility, improper body mechanics, and pain.  ? ?ACTIVITY LIMITATIONS cleaning, community activity, meal prep, shopping, and physical fitness .  ? ?PERSONAL FACTORS 1 comorbidity: h/o hernia repair  are also affecting patient's functional outcome.  ? ? ?REHAB POTENTIAL: Good ? ?CLINICAL DECISION MAKING: Stable/uncomplicated ? ?EVALUATION COMPLEXITY: Low ? ? ?GOALS: ?Goals reviewed with patient? Yes ? ? ?LONG TERM GOALS: ? ?Pt will be able to stand from chairs without UE assist ?Baseline: required due to pain at eval ?Target date: 05/15/2021 ?Goal status: INITIAL ? ?2.  Pt will return to a walking program progression for exercise without limitation by SIJ pain ?Baseline: limited right now due to pain ?Target date: 05/15/2021 ?Goal status: INITIAL ? ?3.  Independent with long term HEP ?Baseline: will progress as appropriate ?Target date: 05/15/2021 ?Goal status: INITIAL ? ? ?PLAN: ?PT FREQUENCY: 1-2x/week ? ?PT DURATION: 4 weeks ? ?PLANNED INTERVENTIONS: Therapeutic exercises, Therapeutic activity, Neuromuscular re-education, Balance training, Gait training, Patient/Family education, Joint mobilization, Stair training, Aquatic Therapy, Dry Needling, Electrical stimulation, Spinal mobilization, Cryotherapy, Moist heat, Taping, and Manual therapy ? ?PLAN FOR NEXT SESSION: continue trigger point release, consider DN PRN, glut activation ? ? ?Gerrald Basu C. Sariyah Corcino PT, DPT ?04/17/21 12:43 PM ? ?

## 2021-04-21 ENCOUNTER — Ambulatory Visit (HOSPITAL_BASED_OUTPATIENT_CLINIC_OR_DEPARTMENT_OTHER): Payer: Medicare Other | Admitting: Physical Therapy

## 2021-04-21 ENCOUNTER — Encounter (HOSPITAL_BASED_OUTPATIENT_CLINIC_OR_DEPARTMENT_OTHER): Payer: Self-pay | Admitting: Physical Therapy

## 2021-04-21 ENCOUNTER — Other Ambulatory Visit: Payer: Self-pay

## 2021-04-21 DIAGNOSIS — M533 Sacrococcygeal disorders, not elsewhere classified: Secondary | ICD-10-CM | POA: Diagnosis not present

## 2021-04-21 DIAGNOSIS — M62830 Muscle spasm of back: Secondary | ICD-10-CM | POA: Diagnosis not present

## 2021-04-21 NOTE — Therapy (Signed)
?OUTPATIENT PHYSICAL THERAPY TREATMENT NOTE ? ? ?Patient Name: [name] ?MRN: [id number] ?DOB:Feb 09, 1933, 88 y.o., male ?Today's Date: 04/21/2021 ? ?PCP: Josetta Huddle, MD ?REFERRING PROVIDER: Josetta Huddle, MD ? ? PT End of Session - 04/21/21 1106   ? ? Visit Number 2   ? Number of Visits 7   ? Date for PT Re-Evaluation 05/08/21   ? Authorization Type MCR   ? Progress Note Due on Visit 10   ? PT Start Time 1104   ? PT Stop Time 1146   ? PT Time Calculation (min) 42 min   ? Activity Tolerance Patient tolerated treatment well   ? Behavior During Therapy [facility] for tasks assessed/performed   ? ?  ?  ? ?  ? ? ?Past Medical History:  ?Diagnosis Date  ? Asthma   ? Cancer [facility])   ? prostate with seeds  ? Coronary artery disease   ? Diabetes mellitus   ? High cholesterol   ? ?Past Surgical History:  ?Procedure Laterality Date  ? CATARACT EXTRACTION W/PHACO Right 1998  ? Dr. Karna Christmas  ? CATARACT EXTRACTION W/PHACO Left 2003  ? Dr. Davonna Belling  ? CATARACT EXTRACTION, BILATERAL  1998, 2002  ? CORONARY ARTERY BYPASS GRAFT    ? HERNIA REPAIR    ? INSERTION PROSTATE RADIATION SEED    ? LEG SURGERY    ? ?Patient Active Problem List  ? Diagnosis Date Noted  ? Vitreous membranes or strands, left 03/26/2021  ? Posterior vitreous detachment of both eyes 03/26/2021  ? Obstructive sleep apnea hypopnea, moderate 06/26/2020  ? Cholesterol retinal embolus of left eye 06/05/2019  ? Diabetes mellitus without complication ([place]) 38/18/2993  ? Extrinsic asthma with exacerbation 06/05/2019  ? Cough variant asthma vs uacs 10/06/2017  ? Hematuria 09/30/2011  ? Hyponatremia 09/30/2011  ? Acute on chronic renal failure ([place]) 09/30/2011  ? Leukocytosis 09/30/2011  ? Proteinuria 09/30/2011  ? ? ?REFERRING DIAG: M53.3 (ICD-10-CM) - Sacrococcygeal disorders, not elsewhere classified ? ?THERAPY DIAG:  ?Sacrococcygeal disorders, not elsewhere classified ? ?Muscle spasm of back ? ?PERTINENT HISTORY: h/o hernia repair ? ?PRECAUTIONS: none ? ?SUBJECTIVE: still  unable to do bridge unassisted. Felt muscle irritation at about 4am after last session, worked it out and was able to decrease prednisone ? ?PAIN:  ?Are you having pain? Yes ?NPRS scale: 2/10 ?Pain location: Rt SIJ ?Aggravating factors: SB to the Rt in seated ?Relieving factors: posture ? ? ?OBJECTIVE:  ?  ?POSTURE:  ?Decreased thoracic kyphosis & lumbar lordosis ?  ?PALPATION: ?Rt anterior innominate rotation ?  ?  ?LUMBARAROM/PROM ?  ?A/PROM A/PROM  ?04/17/2021  ?Flexion    ?Extension    ?Right lateral flexion    ?Left lateral flexion    ?Right rotation    ?Left rotation    ? (Blank rows = not tested) ?  ?LE AROM/PROM: ?  ?A/PROM Right ?04/17/2021 Left ?04/17/2021  ?Hip flexion      ?Hip extension      ?Hip abduction      ?Hip adduction      ?Hip internal rotation      ?Hip external rotation      ?Knee flexion      ?Knee extension      ?Ankle dorsiflexion      ?Ankle plantarflexion      ?Ankle inversion      ?Ankle eversion      ? (Blank rows = not tested) ?  ?LE MMT: unable to demo stand from chair without  UEs due to pain at eval ?  ?MMT Right ?04/17/2021 Left ?04/17/2021  ?Hip flexion      ?Hip extension      ?Hip abduction      ?Hip adduction      ?Hip internal rotation      ?Hip external rotation      ?Knee flexion      ?Knee extension      ?Ankle dorsiflexion      ?Ankle plantarflexion      ?Ankle inversion      ?Ankle eversion      ? (Blank rows = not tested) ?  ?  ?  ?GAIT: ?  ?Comments: pt does not present with antalgic gait, slight forward flexion ?  ?  ?  ?TODAY'S TREATMENT  ?3/7: ?MANUAL: Rt upper and lower quadrant sacral PA hold; trigger point release Rt piriformis, glut med/min ?Seated flexion physioball roll out ?Hooklying ab set with iso physioball press ?LTR ?Hooklying clam with red tband ?Sit<>stand ? ?EVAL: ?MANUAL: STM fibers of gluts and QL near the Rt SIJ, sustained PA pressure to Rt upper quadrant of sacrum 2x2 min ?THEREX: hooklying adduction with abdominal engagement ?                   Mini bridge  outside of pain ?                                       DKTC with deep breathing ?                                       Seated iso adduction in resting posture ?  ?  ?PATIENT EDUCATION:  ?Education details: exercise form/rationale ?Person educated: Patient ?Education method: Explanation, Demonstration, Tactile cues, Verbal cues, and Handouts ?Education comprehension: verbalized understanding, returned demonstration, verbal cues required, tactile cues required, and needs further education ?  ?  ?HOME EXERCISE PROGRAM: ?[id number] ?  ?ASSESSMENT: ?  ?CLINICAL IMPRESSION: ?Cont limitation in Rt SIJ mobility with trigger points creating concordant pain- all decreased with manual therapy today. Cues required for exercise form and explained rationale. Bridges continue to be painful at SIJ- removed from HEP.  ? ? ?  ?  ?OBJECTIVE IMPAIRMENTS decreased activity tolerance, difficulty walking, decreased strength, increased muscle spasms, impaired flexibility, improper body mechanics, and pain.  ?  ?ACTIVITY LIMITATIONS cleaning, community activity, meal prep, shopping, and physical fitness .  ?  ?PERSONAL FACTORS 1 comorbidity: h/o hernia repair  are also affecting patient's functional outcome.  ?  ?  ?REHAB POTENTIAL: Good ?  ?CLINICAL DECISION MAKING: Stable/uncomplicated ?  ?EVALUATION COMPLEXITY: Low ?  ?  ?GOALS: ?Goals reviewed with patient? Yes ?  ?  ?LONG TERM GOALS: ?  ?Pt will be able to stand from chairs without UE assist ?Baseline: required due to pain at eval ?Target date: 05/15/2021 ?Goal status: INITIAL ?  ?2.  Pt will return to a walking program progression for exercise without limitation by SIJ pain ?Baseline: limited right now due to pain ?Target date: 05/15/2021 ?Goal status: INITIAL ?  ?3.  Independent with long term HEP ?Baseline: will progress as appropriate ?Target date: 05/15/2021 ?Goal status: INITIAL ?  ?  ?PLAN: ?PT FREQUENCY: 1-2x/week ?  ?PT DURATION: 4 weeks ?  ?PLANNED INTERVENTIONS: Therapeutic  exercises, Therapeutic activity,  Neuromuscular re-education, Balance training, Gait training, Patient/Family education, Joint mobilization, Stair training, Aquatic Therapy, Dry Needling, Electrical stimulation, Spinal mobilization, Cryotherapy, Moist heat, Taping, and Manual therapy ?  ?PLAN FOR NEXT SESSION: consider DN PRN, progress standing glut activation ?  ? ?Breniyah Romm C. Panayiota Larkin PT, DPT ?04/21/21 7:53 PM ? ?  ? ?

## 2021-04-23 ENCOUNTER — Ambulatory Visit (INDEPENDENT_AMBULATORY_CARE_PROVIDER_SITE_OTHER): Payer: Medicare Other | Admitting: Ophthalmology

## 2021-04-23 ENCOUNTER — Other Ambulatory Visit: Payer: Self-pay

## 2021-04-23 ENCOUNTER — Encounter (INDEPENDENT_AMBULATORY_CARE_PROVIDER_SITE_OTHER): Payer: Self-pay | Admitting: Ophthalmology

## 2021-04-23 DIAGNOSIS — H43312 Vitreous membranes and strands, left eye: Secondary | ICD-10-CM | POA: Diagnosis not present

## 2021-04-23 DIAGNOSIS — E119 Type 2 diabetes mellitus without complications: Secondary | ICD-10-CM

## 2021-04-23 NOTE — Assessment & Plan Note (Signed)
No detectable diabetic retinopathy 

## 2021-04-23 NOTE — Progress Notes (Signed)
04/23/2021     CHIEF COMPLAINT Patient presents for  Chief Complaint  Patient presents with   Retina Follow Up      HISTORY OF PRESENT ILLNESS: [name] is a 88 y.o. male who presents to the clinic today for:   HPI     Retina Follow Up           Diagnosis: Other         Comments   4 week fu OS fp oct No recent changes to vision. Pt states less floaters than he noticed before. Pt denies FOL. OS, symptomatology has improved dramatically.  The large 3 floaters disappeared overnight 1 day sometime ago and has now remained stable with no recurrence        Last edited by Hurman Horn, MD on 04/23/2021 10:15 AM.      Referring physician: Josetta Huddle, MD [address] [place],   [address]  HISTORICAL INFORMATION:   Selected notes from the MEDICAL RECORD NUMBER    Lab Results  Component Value Date   HGBA1C 6.2 (H) 09/30/2011     CURRENT MEDICATIONS: No current outpatient medications on file. (Ophthalmic Drugs)   No current facility-administered medications for this visit. (Ophthalmic Drugs)   Current Outpatient Medications (Other)  Medication Sig   albuterol (PROVENTIL HFA;VENTOLIN HFA) 108 (90 BASE) MCG/ACT inhaler Inhale 2 puffs into the lungs every 6 (six) hours as needed. For shortness of breath.   aspirin EC 81 MG tablet Take 81 mg by mouth daily.   atorvastatin (LIPITOR) 40 MG tablet Take 40 mg by mouth daily.   Blood Glucose Monitoring Suppl ([place]) w/Device KIT [id number]   Cholecalciferol (VITAMIN D-1000 MAX ST PO) Take 3,000 mg by mouth.   Coenzyme Q10 (COQ10) 400 MG CAPS Take by mouth.   esomeprazole (NEXIUM) 40 MG capsule TAKE 1 CAPSULE BY MOUTH  DAILY IN THE MORNING   famotidine (PEPCID) 20 MG tablet Take 2 tablets every evening as directed (Patient taking differently: Take 20 mg by mouth at bedtime. Take 2 tablets every evening as directed)   fexofenadine (ALLEGRA) 180 MG tablet Take 180 mg by mouth.  Takes 2 tablets in the morning, 1 tablet 6 hours later, then 2 tablets 8 hours later.   FLOVENT HFA 110 MCG/ACT inhaler    IGLUCOSE TEST STRIPS test strip [id number]   Lancets (STERILANCE TL) MISC [id number]   metFORMIN (GLUCOPHAGE) 500 MG tablet Take 500-1,000 mg by mouth at bedtime. Take 500 mg every morning Take 1000 mg at bedtime   mometasone-formoterol (DULERA) 200-5 MCG/ACT AERO Inhale 2 puffs twice daily with spacer to prevent cough/wheeze. Rinse, gargle and spit after each use.   vitamin B-12 (CYANOCOBALAMIN) 1000 MCG tablet Take 3,000 mcg by mouth daily.   No current facility-administered medications for this visit. (Other)      REVIEW OF SYSTEMS: ROS   Negative for: Constitutional, Gastrointestinal, Neurological, Skin, Genitourinary, Musculoskeletal, HENT, Endocrine, Cardiovascular, Eyes, Respiratory, Psychiatric, Allergic/Imm, Heme/Lymph Last edited by Silvestre Moment on 04/23/2021  9:46 AM.       ALLERGIES Allergies  Allergen Reactions   Lisinopril Cough   Sulfa Antibiotics Hives    PAST MEDICAL HISTORY Past Medical History:  Diagnosis Date   Asthma    Cancer ([place])    prostate with seeds   Coronary artery disease    Diabetes mellitus    High cholesterol    Past Surgical History:  Procedure Laterality Date   CATARACT EXTRACTION  W/PHACO Right 1998   Dr. Karna Christmas   CATARACT EXTRACTION W/PHACO Left 2003   Dr. Davonna Belling   CATARACT EXTRACTION, BILATERAL  1998, 2002   CORONARY ARTERY BYPASS GRAFT     HERNIA REPAIR     INSERTION PROSTATE RADIATION SEED     LEG SURGERY      FAMILY HISTORY Family History  Problem Relation Age of Onset   Heart attack Mother    Prostate cancer Father    Colon cancer Paternal Grandfather    Allergic rhinitis Neg Hx    Angioedema Neg Hx    Asthma Neg Hx    Atopy Neg Hx    Eczema Neg Hx    Immunodeficiency Neg Hx    Urticaria Neg Hx     SOCIAL HISTORY Social History   Tobacco Use   Smoking status: Never   Smokeless tobacco: Never  Vaping  Use   Vaping Use: Never used  Substance Use Topics   Alcohol use: Yes    Alcohol/week: 10.0 standard drinks    Types: 10 Standard drinks or equivalent per week    Comment: 1-2 drinks 4-5 days per week   Drug use: No         OPHTHALMIC EXAM:  Base Eye Exam     Visual Acuity (Snellen - Linear)       Right Left   Dist [place] 20/20 -1 20/20         Tonometry (Tonopen, 9:52 AM)       Right Left   Pressure 12 13         Pupils       Pupils Dark Light Shape React APD   Right PERRL 4 3 Round Slow None   Left PERRL 4 3 Round Slow None         Visual Fields       Left Right    Full          Extraocular Movement       Right Left    Full Full         Neuro/Psych     Oriented x3: Yes   Mood/Affect: Normal         Dilation     Left eye: 1.0% Mydriacyl, 2.5% Phenylephrine @ 9:51 AM           Slit Lamp and Fundus Exam     External Exam       Right Left   External Normal Normal         Slit Lamp Exam       Right Left   Lids/Lashes Normal Normal   Conjunctiva/Sclera White and quiet White and quiet   Cornea Clear Clear   Anterior Chamber Deep and quiet Deep and quiet   Iris Round and reactive Round and reactive   Lens Posterior chamber intraocular lens Posterior chamber intraocular lens, Open posterior capsule intraorally   Anterior Vitreous Normal Normal, no pigment anteriorly         Fundus Exam       Right Left   Posterior Vitreous  Posterior vitreous detachment, vitreous membranes and strands, no pigment posteriorly no pigment anterior   Disc  Normal   C/D Ratio  0.15   Macula  Normal   Vessels  Normal,,    Periphery  Normal, no holes or tears, with 25 D and 20 D examinations to the ora serrata seen with 25            IMAGING  AND PROCEDURES  Imaging and Procedures for 04/23/21  OCT, Retina - OU - Both Eyes       Right Eye Quality was good. Scan locations included subfoveal. Central Foveal Thickness: 257.  Progression has been stable.   Left Eye Quality was good. Scan locations included subfoveal. Central Foveal Thickness: 280. Progression has been stable.   Notes No active maculopathy, no topographic distortion OU     Color Fundus Photography Optos - OU - Both Eyes       Right Eye Progression has improved. Disc findings include normal observations. Macula : normal observations. Vessels : normal observations. Periphery : normal observations.   Left Eye Progression has improved. Disc findings include normal observations. Macula : normal observations. Vessels : normal observations. Periphery : normal observations.   Notes Posterior vitreous detachment OU no retinal holes or tears today  PVD OS seen in the past, now with more vitreous membranes and strands particularly inferiorly and seen well on infrared view             ASSESSMENT/PLAN:  Vitreous membranes or strands, left Minor and not symptomatic OS we will continue to observe  Diabetes mellitus without complication (HCC) No detectable diabetic retinopathy     ICD-10-CM   1. Vitreous membranes or strands, left  H43.312 OCT, Retina - OU - Both Eyes    Color Fundus Photography Optos - OU - Both Eyes    2. Diabetes mellitus without complication (HCC)  K48.1       1.  OS, no retinal holes or tears, symptomatic floaters have diminished.  Recent PVD.  2.  3.  Ophthalmic Meds Ordered this visit:  No orders of the defined types were placed in this encounter.      Return in about 1 year (around 04/24/2022) for DILATE OU, COLOR FP, OCT.  There are no Patient Instructions on file for this visit.   Explained the diagnoses, plan, and follow up with the patient and they expressed understanding.  Patient expressed understanding of the importance of proper follow up care.   Clent Demark [place] Souter M.D. Diseases & Surgery of the Retina and Vitreous Retina & Diabetic [place] 04/23/21     Abbreviations: M myopia  (nearsighted); A astigmatism; H hyperopia (farsighted); P presbyopia; Mrx spectacle prescription;  CTL contact lenses; OD right eye; OS left eye; OU both eyes  XT exotropia; ET esotropia; PEK punctate epithelial keratitis; PEE punctate epithelial erosions; DES dry eye syndrome; MGD meibomian gland dysfunction; ATs artificial tears; PFAT's preservative free artificial tears; [place] nuclear sclerotic cataract; PSC posterior subcapsular cataract; ERM epi-retinal membrane; PVD posterior vitreous detachment; RD retinal detachment; DM diabetes mellitus; DR diabetic retinopathy; NPDR non-proliferative diabetic retinopathy; PDR proliferative diabetic retinopathy; CSME clinically significant macular edema; DME diabetic macular edema; dbh dot blot hemorrhages; CWS cotton wool spot; POAG primary open angle glaucoma; C/D cup-to-disc ratio; HVF humphrey visual field; GVF goldmann visual field; OCT optical coherence tomography; IOP intraocular pressure; BRVO Branch retinal vein occlusion; CRVO central retinal vein occlusion; CRAO central retinal artery occlusion; BRAO branch retinal artery occlusion; RT retinal tear; SB scleral buckle; PPV pars plana vitrectomy; VH Vitreous hemorrhage; PRP panretinal laser photocoagulation; IVK intravitreal kenalog; VMT vitreomacular traction; MH Macular hole;  NVD neovascularization of the disc; NVE neovascularization elsewhere; AREDS age related eye disease study; ARMD age related macular degeneration; POAG primary open angle glaucoma; EBMD epithelial/anterior basement membrane dystrophy; ACIOL anterior chamber intraocular lens; IOL intraocular lens; PCIOL posterior chamber intraocular lens; Phaco/IOL phacoemulsification with intraocular lens  placement; [place] photorefractive keratectomy; LASIK laser assisted in situ keratomileusis; HTN hypertension; DM diabetes mellitus; COPD chronic obstructive pulmonary disease

## 2021-04-23 NOTE — Assessment & Plan Note (Signed)
Minor and not symptomatic OS we will continue to observe ?

## 2021-04-27 ENCOUNTER — Encounter (HOSPITAL_BASED_OUTPATIENT_CLINIC_OR_DEPARTMENT_OTHER): Payer: Self-pay | Admitting: Physical Therapy

## 2021-04-27 ENCOUNTER — Ambulatory Visit (HOSPITAL_BASED_OUTPATIENT_CLINIC_OR_DEPARTMENT_OTHER): Payer: Medicare Other | Admitting: Physical Therapy

## 2021-04-27 ENCOUNTER — Other Ambulatory Visit: Payer: Self-pay

## 2021-04-27 DIAGNOSIS — M62830 Muscle spasm of back: Secondary | ICD-10-CM

## 2021-04-27 DIAGNOSIS — M533 Sacrococcygeal disorders, not elsewhere classified: Secondary | ICD-10-CM | POA: Diagnosis not present

## 2021-04-27 NOTE — Therapy (Signed)
?OUTPATIENT PHYSICAL THERAPY TREATMENT NOTE ? ? ?Patient Name: [name] ?MRN: [id number] ?DOB:08/17/1932, 88 y.o., male ?Today's Date: 04/27/2021 ? ?PCP: Josetta Huddle, MD ?REFERRING PROVIDER: Josetta Huddle, MD ? ? PT End of Session - 04/27/21 1113   ? ? Visit Number 3   ? Number of Visits 7   ? Date for PT Re-Evaluation 05/08/21   ? Authorization Type MCR   ? Progress Note Due on Visit 10   ? PT Start Time 1112   ? PT Stop Time 1154   ? PT Time Calculation (min) 42 min   ? Activity Tolerance Patient tolerated treatment well   ? Behavior During Therapy [facility] for tasks assessed/performed   ? ?  ?  ? ?  ? ? ?Past Medical History:  ?Diagnosis Date  ? Asthma   ? Cancer [facility])   ? prostate with seeds  ? Coronary artery disease   ? Diabetes mellitus   ? High cholesterol   ? ?Past Surgical History:  ?Procedure Laterality Date  ? CATARACT EXTRACTION W/PHACO Right 1998  ? Dr. Karna Christmas  ? CATARACT EXTRACTION W/PHACO Left 2003  ? Dr. Davonna Belling  ? CATARACT EXTRACTION, BILATERAL  1998, 2002  ? CORONARY ARTERY BYPASS GRAFT    ? HERNIA REPAIR    ? INSERTION PROSTATE RADIATION SEED    ? LEG SURGERY    ? ?Patient Active Problem List  ? Diagnosis Date Noted  ? Vitreous membranes or strands, left 03/26/2021  ? Posterior vitreous detachment of both eyes 03/26/2021  ? Obstructive sleep apnea hypopnea, moderate 06/26/2020  ? Cholesterol retinal embolus of left eye 06/05/2019  ? Diabetes mellitus without complication ([place]) 36/62/9476  ? Extrinsic asthma with exacerbation 06/05/2019  ? Cough variant asthma vs uacs 10/06/2017  ? Hematuria 09/30/2011  ? Hyponatremia 09/30/2011  ? Acute on chronic renal failure ([place]) 09/30/2011  ? Leukocytosis 09/30/2011  ? Proteinuria 09/30/2011  ? ? ?REFERRING DIAG: M53.3 (ICD-10-CM) - Sacrococcygeal disorders, not elsewhere classified ? ?THERAPY DIAG:  ?Sacrococcygeal disorders, not elsewhere classified ? ?Muscle spasm of back ? ?PERTINENT HISTORY: h/o hernia repair ? ?PRECAUTIONS: none ? ?SUBJECTIVE: bilateral  ankle edema that started Wed- trying to get a hold of PCP. I am having the same pain around the SIJ- using massage gun still.  ? ?PAIN:  ?Are you having pain? Yes ?NPRS scale: 2/10 ?Pain location: Rt SIJ ?Aggravating factors: SB to the Rt in seated ?Relieving factors: posture ? ? ?OBJECTIVE:  ?  ?POSTURE:  ?Decreased thoracic kyphosis & lumbar lordosis ?  ?PALPATION: ?Rt anterior innominate rotation ?  ?  ?LUMBARAROM/PROM: 3/13- grossly limited with knees flexing in sidebending and flexing ?  ?A/PROM A/PROM  ?04/17/2021  ?Flexion    ?Extension    ?Right lateral flexion    ?Left lateral flexion    ?Right rotation    ?Left rotation    ? (Blank rows = not tested) ?  ?LE AROM/PROM: ?  ?A/PROM Right ?04/17/2021 Left ?04/17/2021  ?Hip flexion      ?Hip extension      ?Hip abduction      ?Hip adduction      ?Hip internal rotation      ?Hip external rotation      ?Knee flexion      ?Knee extension      ?Ankle dorsiflexion      ?Ankle plantarflexion      ?Ankle inversion      ?Ankle eversion      ? (Blank rows =  not tested) ?  ?LE MMT: unable to demo stand from chair without UEs due to pain at eval ?  ?MMT Right ?04/17/2021 Left ?04/17/2021  ?Hip flexion      ?Hip extension      ?Hip abduction      ?Hip adduction      ?Hip internal rotation      ?Hip external rotation      ?Knee flexion      ?Knee extension      ?Ankle dorsiflexion      ?Ankle plantarflexion      ?Ankle inversion      ?Ankle eversion      ? (Blank rows = not tested) ?  ?  ?  ?GAIT: ?  ?Comments: pt does not present with antalgic gait, slight forward flexion ?  ?  ?  ?TODAY'S TREATMENT  ?3/13: ?MANUAL:TPDN skilled palpation and monitoring with education (Rt piriformis, glut med); hip mobilization- lateral distraction & end range flexion/adduction AP mobs ?Figure 4 stretch ?LTR ?Sit<>stand ? ?3/7: ?MANUAL: Rt upper and lower quadrant sacral PA hold; trigger point release Rt piriformis, glut med/min ?Seated flexion physioball roll out ?Hooklying ab set with iso  physioball press ?LTR ?Hooklying clam with red tband ?Sit<>stand ? ?EVAL: ?MANUAL: STM fibers of gluts and QL near the Rt SIJ, sustained PA pressure to Rt upper quadrant of sacrum 2x2 min ?THEREX: hooklying adduction with abdominal engagement ?                   Mini bridge outside of pain ?                                       DKTC with deep breathing ?                                       Seated iso adduction in resting posture ?  ?  ?PATIENT EDUCATION:  ?Education details: exercise form/rationale, edema & history review ?Person educated: Patient ?Education method: Explanation, Demonstration, Tactile cues, Verbal cues, and Handouts ?Education comprehension: verbalized understanding, returned demonstration, verbal cues required, tactile cues required, and needs further education ?  ?  ?HOME EXERCISE PROGRAM: ?[id number] ?  ?ASSESSMENT: ?  ?CLINICAL IMPRESSION: ?Pt cont to complain of return of concordant pain in muscular area running from Rt SIJ to hip joint. Utilized dry needling today as manual therapy was helpful and pt was sore as expected. Noted that the right hip joint had less mobility vs the Left and mobilizations were applied to decrease demand from SIJ. Plans to speak with PCP about bil ankle edema.  ? ?  ?  ?OBJECTIVE IMPAIRMENTS decreased activity tolerance, difficulty walking, decreased strength, increased muscle spasms, impaired flexibility, improper body mechanics, and pain.  ?  ?ACTIVITY LIMITATIONS cleaning, community activity, meal prep, shopping, and physical fitness .  ?  ?PERSONAL FACTORS 1 comorbidity: h/o hernia repair  are also affecting patient's functional outcome.  ?  ?  ?REHAB POTENTIAL: Good ?  ?CLINICAL DECISION MAKING: Stable/uncomplicated ?  ?EVALUATION COMPLEXITY: Low ?  ?  ?GOALS: ?Goals reviewed with patient? Yes ?  ?  ?LONG TERM GOALS: ?  ?Pt will be able to stand from chairs without UE assist ?Baseline: required due to pain at eval ?Target date: 05/15/2021 ?Goal status: INITIAL ?   ?  2.  Pt will return to a walking program progression for exercise without limitation by SIJ pain ?Baseline: limited right now due to pain ?Target date: 05/15/2021 ?Goal status: INITIAL ?  ?3.  Independent with long term HEP ?Baseline: will progress as appropriate ?Target date: 05/15/2021 ?Goal status: INITIAL ?  ?  ?PLAN: ?PT FREQUENCY: 1-2x/week ?  ?PT DURATION: 4 weeks ?  ?PLANNED INTERVENTIONS: Therapeutic exercises, Therapeutic activity, Neuromuscular re-education, Balance training, Gait training, Patient/Family education, Joint mobilization, Stair training, Aquatic Therapy, Dry Needling, Electrical stimulation, Spinal mobilization, Cryotherapy, Moist heat, Taping, and Manual therapy ?  ?PLAN FOR NEXT SESSION: outcome of DN? Mobility to lumbar spine if no change from hip mobility.  ?  ? ?Elora Wolter C. Lorain Keast PT, DPT ?04/27/21 4:22 PM ? ?  ? ?

## 2021-04-28 DIAGNOSIS — M7918 Myalgia, other site: Secondary | ICD-10-CM | POA: Diagnosis not present

## 2021-04-28 DIAGNOSIS — R609 Edema, unspecified: Secondary | ICD-10-CM | POA: Diagnosis not present

## 2021-04-29 ENCOUNTER — Encounter (HOSPITAL_BASED_OUTPATIENT_CLINIC_OR_DEPARTMENT_OTHER): Payer: Self-pay | Admitting: Physical Therapy

## 2021-04-29 ENCOUNTER — Other Ambulatory Visit: Payer: Self-pay

## 2021-04-29 ENCOUNTER — Ambulatory Visit (HOSPITAL_BASED_OUTPATIENT_CLINIC_OR_DEPARTMENT_OTHER): Payer: Medicare Other | Admitting: Physical Therapy

## 2021-04-29 DIAGNOSIS — M62830 Muscle spasm of back: Secondary | ICD-10-CM

## 2021-04-29 DIAGNOSIS — M533 Sacrococcygeal disorders, not elsewhere classified: Secondary | ICD-10-CM | POA: Diagnosis not present

## 2021-04-29 NOTE — Therapy (Signed)
?OUTPATIENT PHYSICAL THERAPY TREATMENT NOTE ? ? ?Patient Name: [name] ?MRN: [id number] ?DOB:Oct 02, 1932, 88 y.o., male ?Today's Date: 04/29/2021 ? ?PCP: Josetta Huddle, MD ?REFERRING PROVIDER: Josetta Huddle, MD ? ? PT End of Session - 04/29/21 1108   ? ? Visit Number 4   ? Number of Visits 7   ? Date for PT Re-Evaluation 05/08/21   ? Authorization Type MCR   ? Progress Note Due on Visit 10   ? PT Start Time 1100   ? PT Stop Time 1140   ? PT Time Calculation (min) 40 min   ? Activity Tolerance Patient tolerated treatment well   ? Behavior During Therapy [facility] for tasks assessed/performed   ? ?  ?  ? ?  ? ? ?Past Medical History:  ?Diagnosis Date  ? Asthma   ? Cancer [facility])   ? prostate with seeds  ? Coronary artery disease   ? Diabetes mellitus   ? High cholesterol   ? ?Past Surgical History:  ?Procedure Laterality Date  ? CATARACT EXTRACTION W/PHACO Right 1998  ? Dr. Karna Christmas  ? CATARACT EXTRACTION W/PHACO Left 2003  ? Dr. Davonna Belling  ? CATARACT EXTRACTION, BILATERAL  1998, 2002  ? CORONARY ARTERY BYPASS GRAFT    ? HERNIA REPAIR    ? INSERTION PROSTATE RADIATION SEED    ? LEG SURGERY    ? ?Patient Active Problem List  ? Diagnosis Date Noted  ? Vitreous membranes or strands, left 03/26/2021  ? Posterior vitreous detachment of both eyes 03/26/2021  ? Obstructive sleep apnea hypopnea, moderate 06/26/2020  ? Cholesterol retinal embolus of left eye 06/05/2019  ? Diabetes mellitus without complication ([place]) 78/67/6720  ? Extrinsic asthma with exacerbation 06/05/2019  ? Cough variant asthma vs uacs 10/06/2017  ? Hematuria 09/30/2011  ? Hyponatremia 09/30/2011  ? Acute on chronic renal failure ([place]) 09/30/2011  ? Leukocytosis 09/30/2011  ? Proteinuria 09/30/2011  ? ? ?REFERRING DIAG: M53.3 (ICD-10-CM) - Sacrococcygeal disorders, not elsewhere classified ? ?THERAPY DIAG:  ?Sacrococcygeal disorders, not elsewhere classified ? ?Muscle spasm of back ? ?PERTINENT HISTORY: h/o hernia repair ? ?PRECAUTIONS: none ? ?SUBJECTIVE: felt no  pain for 16 hrs but still woke up at 4am and had to use massager.  ? ?PAIN:  ?Are you having pain? Yes ?NPRS scale: 2/10 ?Pain location: Rt SIJ ?Aggravating factors: SB to the Rt in seated ?Relieving factors: posture ? ? ?OBJECTIVE:  ?  ?POSTURE:  ?Decreased thoracic kyphosis & lumbar lordosis ?  ?PALPATION: ?Rt anterior innominate rotation ?  ?  ?LUMBARAROM/PROM: 3/13- grossly limited with knees flexing in sidebending and flexing ?  ?A/PROM A/PROM  ?04/17/2021  ?Flexion    ?Extension    ?Right lateral flexion    ?Left lateral flexion    ?Right rotation    ?Left rotation    ? (Blank rows = not tested) ?  ?LE AROM/PROM: ?  ?A/PROM Right ?04/17/2021 Left ?04/17/2021  ?Hip flexion      ?Hip extension      ?Hip abduction      ?Hip adduction      ?Hip internal rotation      ?Hip external rotation      ?Knee flexion      ?Knee extension      ?Ankle dorsiflexion      ?Ankle plantarflexion      ?Ankle inversion      ?Ankle eversion      ? (Blank rows = not tested) ?  ?LE MMT: able to stand  without use of hands ?  ?MMT Right ?04/17/2021 Left ?04/17/2021  ?Hip flexion      ?Hip extension      ?Hip abduction      ?Hip adduction      ?Hip internal rotation      ?Hip external rotation      ?Knee flexion      ?Knee extension      ?Ankle dorsiflexion      ?Ankle plantarflexion      ?Ankle inversion      ?Ankle eversion      ? (Blank rows = not tested) ?  ?  ?  ?GAIT: ?  ?Comments: pt does not present with antalgic gait, slight forward flexion ?FLEXIBILITY: ? Able to maintain fully extended knee with trunk upright bilaterally with significantly more pull on the right v Lt ?  ?  ?  ?TODAY'S TREATMENT  ?3/15: ?Manual: TPDN skilled palpation with monitoring and education (Rt hip external rotator group, glut med,min, max), STM to muscle groups following DN; passive hip flexion with slight adduction and AP mobilization, passive hamstring stretching center and lateral bias ?Seated HSS ?Seated piriformis stretch ?Standing hip flexor  stretch ? ?3/13: ?MANUAL:TPDN skilled palpation and monitoring with education (Rt piriformis, glut med); hip mobilization- lateral distraction & end range flexion/adduction AP mobs ?Figure 4 stretch ?LTR ?Sit<>stand ? ?3/7: ?MANUAL: Rt upper and lower quadrant sacral PA hold; trigger point release Rt piriformis, glut med/min ?Seated flexion physioball roll out ?Hooklying ab set with iso physioball press ?LTR ?Hooklying clam with red tband ?Sit<>stand ? ?EVAL: ?MANUAL: STM fibers of gluts and QL near the Rt SIJ, sustained PA pressure to Rt upper quadrant of sacrum 2x2 min ?THEREX: hooklying adduction with abdominal engagement ?                   Mini bridge outside of pain ?                                       DKTC with deep breathing ?                                       Seated iso adduction in resting posture ?  ?  ?PATIENT EDUCATION:  ?Education details: exercise form/rationale, edema & history review ?Person educated: Patient ?Education method: Explanation, Demonstration, Tactile cues, Verbal cues, and Handouts ?Education comprehension: verbalized understanding, returned demonstration, verbal cues required, tactile cues required, and needs further education ?  ?  ?HOME EXERCISE PROGRAM: ?[id number] ?  ?ASSESSMENT: ?  ?CLINICAL IMPRESSION: ?MD is not concerned with ankle edema and it has since gotten better. Increased use of dry needling to the right hip today while discussing how dry needling can assist in decreasing muscle spasm when paired with stretching and strengthening. Asked him to do the 3 stretches hourly and to proactively use massage gun every 2-3 hours. Will extend POC in order to continue working on this beginning at 2/week and adjusting as necessary.  ? ?  ?  ?OBJECTIVE IMPAIRMENTS decreased activity tolerance, difficulty walking, decreased strength, increased muscle spasms, impaired flexibility, improper body mechanics, and pain.  ?  ?ACTIVITY LIMITATIONS cleaning, community activity, meal prep,  shopping, and physical fitness .  ?  ?PERSONAL FACTORS 1 comorbidity: h/o hernia repair  are also affecting patient's functional  outcome.  ?  ?  ?REHAB POTENTIAL: Good ?  ?CLINICAL DECISION MAKING: Stable/uncomplicated ?  ?EVALUATION COMPLEXITY: Low ?  ?  ?GOALS: ?Goals reviewed with patient? Yes ?  ?  ?LONG TERM GOALS: ?  ?Pt will be able to stand from chairs without UE assist ?Baseline: required due to pain at eval ?Target date: 05/15/2021 ?Goal status: INITIAL ?  ?2.  Pt will return to a walking program progression for exercise without limitation by SIJ pain ?Baseline: limited right now due to pain ?Target date: 05/15/2021 ?Goal status: INITIAL ?  ?3.  Independent with long term HEP ?Baseline: will progress as appropriate ?Target date: 05/15/2021 ?Goal status: INITIAL ?  ?  ?PLAN: ?PT FREQUENCY: 1-2x/week ?  ?PT DURATION: 4 weeks ?  ?PLANNED INTERVENTIONS: Therapeutic exercises, Therapeutic activity, Neuromuscular re-education, Balance training, Gait training, Patient/Family education, Joint mobilization, Stair training, Aquatic Therapy, Dry Needling, Electrical stimulation, Spinal mobilization, Cryotherapy, Moist heat, Taping, and Manual therapy ?  ?PLAN FOR NEXT SESSION: DN PRN, did he go past 4am?  Core stability exercises ?  ? ?Jackob Crookston C. Gracen Ringwald PT, DPT ?04/29/21 11:56 AM ? ?  ? ?

## 2021-05-05 ENCOUNTER — Other Ambulatory Visit: Payer: Self-pay

## 2021-05-05 ENCOUNTER — Encounter (HOSPITAL_BASED_OUTPATIENT_CLINIC_OR_DEPARTMENT_OTHER): Payer: Self-pay | Admitting: Physical Therapy

## 2021-05-05 ENCOUNTER — Ambulatory Visit (HOSPITAL_BASED_OUTPATIENT_CLINIC_OR_DEPARTMENT_OTHER): Payer: Medicare Other | Admitting: Physical Therapy

## 2021-05-05 DIAGNOSIS — M533 Sacrococcygeal disorders, not elsewhere classified: Secondary | ICD-10-CM

## 2021-05-05 DIAGNOSIS — M62830 Muscle spasm of back: Secondary | ICD-10-CM

## 2021-05-05 DIAGNOSIS — R609 Edema, unspecified: Secondary | ICD-10-CM | POA: Diagnosis not present

## 2021-05-05 DIAGNOSIS — R350 Frequency of micturition: Secondary | ICD-10-CM | POA: Diagnosis not present

## 2021-05-05 NOTE — Therapy (Signed)
?OUTPATIENT PHYSICAL THERAPY TREATMENT NOTE ? ? ?Patient Name: [name] ?MRN: [id number] ?DOB:February 21, 1932, 88 y.o., male ?Today's Date: 05/05/2021 ? ?PCP: Josetta Huddle, MD ?REFERRING PROVIDER: Josetta Huddle, MD ? ? PT End of Session - 05/05/21 1604   ? ? Visit Number 5   ? Number of Visits 7   ? Date for PT Re-Evaluation 05/08/21   ? Authorization Type MCR   ? Progress Note Due on Visit 10   ? PT Start Time 1515   ? PT Stop Time 1600   ? PT Time Calculation (min) 45 min   ? Activity Tolerance Patient tolerated treatment well   ? Behavior During Therapy [facility] for tasks assessed/performed   ? ?  ?  ? ?  ? ? ? ?Past Medical History:  ?Diagnosis Date  ? Asthma   ? Cancer [facility])   ? prostate with seeds  ? Coronary artery disease   ? Diabetes mellitus   ? High cholesterol   ? ?Past Surgical History:  ?Procedure Laterality Date  ? CATARACT EXTRACTION W/PHACO Right 1998  ? Dr. Karna Christmas  ? CATARACT EXTRACTION W/PHACO Left 2003  ? Dr. Davonna Belling  ? CATARACT EXTRACTION, BILATERAL  1998, 2002  ? CORONARY ARTERY BYPASS GRAFT    ? HERNIA REPAIR    ? INSERTION PROSTATE RADIATION SEED    ? LEG SURGERY    ? ?Patient Active Problem List  ? Diagnosis Date Noted  ? Vitreous membranes or strands, left 03/26/2021  ? Posterior vitreous detachment of both eyes 03/26/2021  ? Obstructive sleep apnea hypopnea, moderate 06/26/2020  ? Cholesterol retinal embolus of left eye 06/05/2019  ? Diabetes mellitus without complication ([place]) 37/34/2876  ? Extrinsic asthma with exacerbation 06/05/2019  ? Cough variant asthma vs uacs 10/06/2017  ? Hematuria 09/30/2011  ? Hyponatremia 09/30/2011  ? Acute on chronic renal failure ([place]) 09/30/2011  ? Leukocytosis 09/30/2011  ? Proteinuria 09/30/2011  ? ? ?REFERRING DIAG: M53.3 (ICD-10-CM) - Sacrococcygeal disorders, not elsewhere classified ? ?THERAPY DIAG:  ?Sacrococcygeal disorders, not elsewhere classified ? ?Muscle spasm of back ? ?PERTINENT HISTORY: h/o hernia repair ? ?PRECAUTIONS: none ? ?SUBJECTIVE: I am  getting up in the night to urinate. Using the buzzer to calm it down for a few minutes. Today is a pretty good day. Not carrying the massager with him today. Was sore for maybe an hour or 2 after last session. The pocket of pain seems to be shrinking.  ? ?PAIN:  ?Are you having pain? Yes ?NPRS scale: 2/10 ?Pain location: Rt SIJ ?Aggravating factors: SB to the Rt in seated ?Relieving factors: posture ? ? ?OBJECTIVE:  ?  ?POSTURE:  ?Decreased thoracic kyphosis & lumbar lordosis ?  ?PALPATION: ?Rt anterior innominate rotation ?  ?  ?LUMBARAROM/PROM: 3/13- grossly limited with knees flexing in sidebending and flexing ?  ?A/PROM A/PROM  ?04/17/2021  ?Flexion    ?Extension    ?Right lateral flexion    ?Left lateral flexion    ?Right rotation    ?Left rotation    ? (Blank rows = not tested) ?  ?LE AROM/PROM: ?  ?A/PROM Right ?04/17/2021 Left ?04/17/2021  ?Hip flexion      ?Hip extension      ?Hip abduction      ?Hip adduction      ?Hip internal rotation      ?Hip external rotation      ?Knee flexion      ?Knee extension      ?Ankle dorsiflexion      ?  Ankle plantarflexion      ?Ankle inversion      ?Ankle eversion      ? (Blank rows = not tested) ?  ?LE MMT: able to stand without use of hands ?  ?MMT Right ?04/17/2021 Left ?04/17/2021  ?Hip flexion      ?Hip extension      ?Hip abduction      ?Hip adduction      ?Hip internal rotation      ?Hip external rotation      ?Knee flexion      ?Knee extension      ?Ankle dorsiflexion      ?Ankle plantarflexion      ?Ankle inversion      ?Ankle eversion      ? (Blank rows = not tested) ?  ?  ?  ?GAIT: ?  ?Comments: pt does not present with antalgic gait, slight forward flexion ?FLEXIBILITY: ? Able to maintain fully extended knee with trunk upright bilaterally with significantly more pull on the right v Lt ?  ?  ?  ?TODAY'S TREATMENT  ?3/21: ?MANUAL: TPDN with skilled palpation and monitoring to Rt piriformis, glut med/min, TFL ?Seated figure 4 stretch bilateral ?SLS at counter with abd  activation ?Gait training- trunk rotation, arm swing hip abd activation ? ?3/15: ?Manual: TPDN skilled palpation with monitoring and education (Rt hip external rotator group, glut med,min, max), STM to muscle groups following DN; passive hip flexion with slight adduction and AP mobilization, passive hamstring stretching center and lateral bias ?Seated HSS ?Seated piriformis stretch ?Standing hip flexor stretch ? ?3/13: ?MANUAL:TPDN skilled palpation and monitoring with education (Rt piriformis, glut med); hip mobilization- lateral distraction & end range flexion/adduction AP mobs ?Figure 4 stretch ?LTR ?Sit<>stand ? ?  ?  ?PATIENT EDUCATION:  ?Education details: exercise form/rationale, edema & history review ?Person educated: Patient ?Education method: Explanation, Demonstration, Tactile cues, Verbal cues, and Handouts ?Education comprehension: verbalized understanding, returned demonstration, verbal cues required, tactile cues required, and needs further education ?  ?  ?HOME EXERCISE PROGRAM: ?[id number] ?  ?ASSESSMENT: ?  ?CLINICAL IMPRESSION: ?DN continues to produce twitches and soreness in place of pain. Working on activation of abductors for level pelvis in gait and pt was able to recognize the proper pattern.  ? ?  ?  ?OBJECTIVE IMPAIRMENTS decreased activity tolerance, difficulty walking, decreased strength, increased muscle spasms, impaired flexibility, improper body mechanics, and pain.  ?  ?ACTIVITY LIMITATIONS cleaning, community activity, meal prep, shopping, and physical fitness .  ?  ?PERSONAL FACTORS 1 comorbidity: h/o hernia repair  are also affecting patient's functional outcome.  ?  ?  ?REHAB POTENTIAL: Good ?  ?CLINICAL DECISION MAKING: Stable/uncomplicated ?  ?EVALUATION COMPLEXITY: Low ?  ?  ?GOALS: ?Goals reviewed with patient? Yes ?  ?  ?LONG TERM GOALS: ?  ?Pt will be able to stand from chairs without UE assist ?Baseline: required due to pain at eval ?Target date: 05/15/2021 ?Goal status:  INITIAL ?  ?2.  Pt will return to a walking program progression for exercise without limitation by SIJ pain ?Baseline: limited right now due to pain ?Target date: 05/15/2021 ?Goal status: INITIAL ?  ?3.  Independent with long term HEP ?Baseline: will progress as appropriate ?Target date: 05/15/2021 ?Goal status: INITIAL ?  ?  ?PLAN: ?PT FREQUENCY: 1-2x/week ?  ?PT DURATION: 4 weeks ?  ?PLANNED INTERVENTIONS: Therapeutic exercises, Therapeutic activity, Neuromuscular re-education, Balance training, Gait training, Patient/Family education, Joint mobilization, Stair training, Aquatic Therapy, Dry Needling,  Electrical stimulation, Spinal mobilization, Cryotherapy, Moist heat, Taping, and Manual therapy ?  ?PLAN FOR NEXT SESSION: DN PRN, review gait, step ups ?  ? ?Roniqua Kintz C. Sam Overbeck PT, DPT ?05/05/21 4:05 PM ? ?  ? ?

## 2021-05-07 ENCOUNTER — Other Ambulatory Visit: Payer: Self-pay

## 2021-05-07 ENCOUNTER — Ambulatory Visit (HOSPITAL_BASED_OUTPATIENT_CLINIC_OR_DEPARTMENT_OTHER): Payer: Medicare Other | Admitting: Physical Therapy

## 2021-05-07 ENCOUNTER — Encounter (HOSPITAL_BASED_OUTPATIENT_CLINIC_OR_DEPARTMENT_OTHER): Payer: Self-pay | Admitting: Physical Therapy

## 2021-05-07 DIAGNOSIS — M533 Sacrococcygeal disorders, not elsewhere classified: Secondary | ICD-10-CM

## 2021-05-07 DIAGNOSIS — M62830 Muscle spasm of back: Secondary | ICD-10-CM | POA: Diagnosis not present

## 2021-05-07 NOTE — Therapy (Signed)
?OUTPATIENT PHYSICAL THERAPY TREATMENT NOTE ? ? ?Patient Name: [name] ?MRN: [id number] ?DOB:[birth year], 88 y.o., male ?Today's Date: 05/07/2021 ? ?PCP: Josetta Huddle, MD ?REFERRING PROVIDER: Josetta Huddle, MD ? ? PT End of Session - 05/07/21 0932   ? ? Visit Number 6   ? Number of Visits 15   ? Date for PT Re-Evaluation 06/12/21   ? Authorization Type MCR   ? Progress Note Due on Visit 10   ? PT Start Time [phone]   ? PT Stop Time 2831   ? PT Time Calculation (min) 45 min   ? Activity Tolerance Patient tolerated treatment well   ? Behavior During Therapy [facility] for tasks assessed/performed   ? ?  ?  ? ?  ? ? ? ? ?Past Medical History:  ?Diagnosis Date  ? Asthma   ? Cancer [facility])   ? prostate with seeds  ? Coronary artery disease   ? Diabetes mellitus   ? High cholesterol   ? ?Past Surgical History:  ?Procedure Laterality Date  ? CATARACT EXTRACTION W/PHACO Right 1998  ? Dr. Karna Christmas  ? CATARACT EXTRACTION W/PHACO Left 2003  ? Dr. Davonna Belling  ? CATARACT EXTRACTION, BILATERAL  1998, 2002  ? CORONARY ARTERY BYPASS GRAFT    ? HERNIA REPAIR    ? INSERTION PROSTATE RADIATION SEED    ? LEG SURGERY    ? ?Patient Active Problem List  ? Diagnosis Date Noted  ? Vitreous membranes or strands, left 03/26/2021  ? Posterior vitreous detachment of both eyes 03/26/2021  ? Obstructive sleep apnea hypopnea, moderate 06/26/2020  ? Cholesterol retinal embolus of left eye 06/05/2019  ? Diabetes mellitus without complication ([place]) 51/76/1607  ? Extrinsic asthma with exacerbation 06/05/2019  ? Cough variant asthma vs uacs 10/06/2017  ? Hematuria 09/30/2011  ? Hyponatremia 09/30/2011  ? Acute on chronic renal failure ([place]) 09/30/2011  ? Leukocytosis 09/30/2011  ? Proteinuria 09/30/2011  ? ? ?REFERRING DIAG: M53.3 (ICD-10-CM) - Sacrococcygeal disorders, not elsewhere classified ? ?THERAPY DIAG:  ?Sacrococcygeal disorders, not elsewhere classified ? ?Muscle spasm of back ? ?PERTINENT HISTORY: h/o hernia repair ? ?PRECAUTIONS: none ? ?SUBJECTIVE: Still  feel like I need to use the massage gun. Still a little sore when pressing into Right hip.  ? ?PAIN:  ?Are you having pain? Yes ?NPRS scale: 3/10 in seated, 0/10 in standing ?Pain location: lateral right hip, rubbing TFL ?Aggravating factors: SB to the Rt in seated ?Relieving factors: posture ? ? ?OBJECTIVE:  ?  ?POSTURE:  ?Decreased thoracic kyphosis & lumbar lordosis ?  ?PALPATION: ?Rt anterior innominate rotation ?  ?  ?LUMBARAROM/PROM: 3/23: able to maintain knee extension ?  ?A/PROM A/PROM  ?04/17/2021  ?Flexion  mid shin  ?Extension    ?Right lateral flexion Knee joint line, pain at Rt SIJ  ?Left lateral flexion 31m past knee joint line   ?Right rotation    ?Left rotation    ? (Blank rows = not tested) ?  ?LE AROM/PROM: ?  ?A/PROM Right ?04/17/2021 Left ?04/17/2021  ?Hip flexion      ?Hip extension      ?Hip abduction      ?Hip adduction      ?Hip internal rotation      ?Hip external rotation      ?Knee flexion      ?Knee extension      ?Ankle dorsiflexion      ?Ankle plantarflexion      ?Ankle inversion      ?Ankle eversion      ? (  Blank rows = not tested) ?  ?LE MMT: able to stand without use of hands ?  ?MMT Right ?04/17/2021 Left ?04/17/2021  ?Hip flexion      ?Hip extension      ?Hip abduction      ?Hip adduction      ?Hip internal rotation      ?Hip external rotation      ?Knee flexion      ?Knee extension      ?Ankle dorsiflexion      ?Ankle plantarflexion      ?Ankle inversion      ?Ankle eversion      ? (Blank rows = not tested) ?  ?  ?  ?GAIT: ?  ?Comments: pt does not present with antalgic gait, slight forward flexion ?FLEXIBILITY: ? Able to maintain fully extended knee with trunk upright bilaterally with significantly more pull on the right v Lt ?  ?  ?  ?TODAY'S TREATMENT  ?3/23: ?MANUAL: TPDN with monitoring and palpation followed by STM and stretching of TFL (Rt TFL, glut med/min) ?Standing- core+glut+lateral weight shift+breathing ? ?3/21: ?MANUAL: TPDN with skilled palpation and monitoring to Rt  piriformis, glut med/min, TFL ?Seated figure 4 stretch bilateral ?SLS at counter with abd activation ?Gait training- trunk rotation, arm swing hip abd activation ? ?3/15: ?Manual: TPDN skilled palpation with monitoring and education (Rt hip external rotator group, glut med,min, max), STM to muscle groups following DN; passive hip flexion with slight adduction and AP mobilization, passive hamstring stretching center and lateral bias ?Seated HSS ?Seated piriformis stretch ?Standing hip flexor stretch ? ? ? ?  ?  ?PATIENT EDUCATION:  ?Education details: exercise form/rationale, edema & history review ?Person educated: Patient ?Education method: Explanation, Demonstration, Tactile cues, Verbal cues, and Handouts ?Education comprehension: verbalized understanding, returned demonstration, verbal cues required, tactile cues required, and needs further education ?  ?  ?HOME EXERCISE PROGRAM: ?[id number] ?  ?ASSESSMENT: ?  ?CLINICAL IMPRESSION: ?DN continues to produce twitches and soreness in place of pain. Working on activation of abductors for level pelvis in gait and pt was able to recognize the proper pattern.  ? ?  ?  ?OBJECTIVE IMPAIRMENTS decreased activity tolerance, difficulty walking, decreased strength, increased muscle spasms, impaired flexibility, improper body mechanics, and pain.  ?  ?ACTIVITY LIMITATIONS cleaning, community activity, meal prep, shopping, and physical fitness .  ?  ?PERSONAL FACTORS 1 comorbidity: h/o hernia repair  are also affecting patient's functional outcome.  ?  ?  ?REHAB POTENTIAL: Good ?  ?CLINICAL DECISION MAKING: Stable/uncomplicated ?  ?EVALUATION COMPLEXITY: Low ?  ?  ?GOALS: ?Goals reviewed with patient? Yes ?  ?  ?LONG TERM GOALS: ?  ?Pt will be able to stand from chairs without UE assist ?Baseline: required due to pain at eval ?Target date: 05/15/2021 ?Goal status: INITIAL ?  ?2.  Pt will return to a walking program progression for exercise without limitation by SIJ  pain ?Baseline: limited right now due to pain ?Target date: 05/15/2021 ?Goal status: INITIAL ?  ?3.  Independent with long term HEP ?Baseline: will progress as appropriate ?Target date: 05/15/2021 ?Goal status: INITIAL ?  ?  ?PLAN: ?PT FREQUENCY: 1-2x/week ?  ?PT DURATION: 4 weeks ?  ?PLANNED INTERVENTIONS: Therapeutic exercises, Therapeutic activity, Neuromuscular re-education, Balance training, Gait training, Patient/Family education, Joint mobilization, Stair training, Aquatic Therapy, Dry Needling, Electrical stimulation, Spinal mobilization, Cryotherapy, Moist heat, Taping, and Manual therapy ?  ?PLAN FOR NEXT SESSION: DN PRN, review gait, step ups ?  ? ?  Tasheema Perrone C. Seeley Hissong PT, DPT ?05/07/21 11:02 AM ? ?  ? ?

## 2021-05-18 ENCOUNTER — Encounter (HOSPITAL_BASED_OUTPATIENT_CLINIC_OR_DEPARTMENT_OTHER): Payer: Self-pay | Admitting: Physical Therapy

## 2021-05-18 ENCOUNTER — Ambulatory Visit (HOSPITAL_BASED_OUTPATIENT_CLINIC_OR_DEPARTMENT_OTHER): Payer: Medicare Other | Attending: Internal Medicine | Admitting: Physical Therapy

## 2021-05-18 DIAGNOSIS — M62830 Muscle spasm of back: Secondary | ICD-10-CM | POA: Diagnosis not present

## 2021-05-18 DIAGNOSIS — M533 Sacrococcygeal disorders, not elsewhere classified: Secondary | ICD-10-CM | POA: Diagnosis not present

## 2021-05-18 NOTE — Therapy (Signed)
?OUTPATIENT PHYSICAL THERAPY TREATMENT NOTE ? ? ?Patient Name: [name] ?MRN: [id number] ?DOB:1932/06/06, 88 y.o., male ?Today's Date: 05/18/2021 ? ?PCP: Josetta Huddle, MD ?REFERRING PROVIDER: Josetta Huddle, MD ? ? PT End of Session - 05/18/21 0940   ? ? Visit Number 7   ? Number of Visits 15   ? Date for PT Re-Evaluation 06/12/21   ? Authorization Type MCR   ? PT Start Time [phone]   ? PT Stop Time 0930   ? PT Time Calculation (min) 44 min   ? Activity Tolerance Patient tolerated treatment well   ? Behavior During Therapy [facility] for tasks assessed/performed   ? ?  ?  ? ?  ? ? ? ? ? ?Past Medical History:  ?Diagnosis Date  ? Asthma   ? Cancer [facility])   ? prostate with seeds  ? Coronary artery disease   ? Diabetes mellitus   ? High cholesterol   ? ?Past Surgical History:  ?Procedure Laterality Date  ? CATARACT EXTRACTION W/PHACO Right 1998  ? Dr. Karna Christmas  ? CATARACT EXTRACTION W/PHACO Left 2003  ? Dr. Davonna Belling  ? CATARACT EXTRACTION, BILATERAL  1998, 2002  ? CORONARY ARTERY BYPASS GRAFT    ? HERNIA REPAIR    ? INSERTION PROSTATE RADIATION SEED    ? LEG SURGERY    ? ?Patient Active Problem List  ? Diagnosis Date Noted  ? Vitreous membranes or strands, left 03/26/2021  ? Posterior vitreous detachment of both eyes 03/26/2021  ? Obstructive sleep apnea hypopnea, moderate 06/26/2020  ? Cholesterol retinal embolus of left eye 06/05/2019  ? Diabetes mellitus without complication ([place]) 60/73/7106  ? Extrinsic asthma with exacerbation 06/05/2019  ? Cough variant asthma vs uacs 10/06/2017  ? Hematuria 09/30/2011  ? Hyponatremia 09/30/2011  ? Acute on chronic renal failure ([place]) 09/30/2011  ? Leukocytosis 09/30/2011  ? Proteinuria 09/30/2011  ? ? ?REFERRING DIAG: M53.3 (ICD-10-CM) - Sacrococcygeal disorders, not elsewhere classified ? ?THERAPY DIAG:  ?Sacrococcygeal disorders, not elsewhere classified ? ?Muscle spasm of back ? ?PERTINENT HISTORY: h/o hernia repair ? ?PRECAUTIONS: none ? ?SUBJECTIVE: The patient has only done yoga over  the past week, and he is no longer having pain.  ? ?PAIN:  ?Are you having pain? None 4/3 ?NPRS scale: None  ?Pain location: lateral right hip, rubbing TFL ?Aggravating factors: SB to the Rt in seated ?Relieving factors: posture ? ? ?OBJECTIVE:  ?  ?POSTURE:  ?Decreased thoracic kyphosis & lumbar lordosis ?  ?PALPATION: ?Rt anterior innominate rotation ?  ?  ?LUMBARAROM/PROM: 3/23: able to maintain knee extension ?  ?A/PROM A/PROM  ?04/17/2021  ?Flexion  mid shin  ?Extension    ?Right lateral flexion Knee joint line, pain at Rt SIJ  ?Left lateral flexion 42m past knee joint line   ?Right rotation    ?Left rotation    ? (Blank rows = not tested) ?  ?LE AROM/PROM: ?  ?A/PROM Right ?04/17/2021 Left ?04/17/2021  ?Hip flexion      ?Hip extension      ?Hip abduction      ?Hip adduction      ?Hip internal rotation      ?Hip external rotation      ?Knee flexion      ?Knee extension      ?Ankle dorsiflexion      ?Ankle plantarflexion      ?Ankle inversion      ?Ankle eversion      ? (Blank rows = not tested) ?  ?  LE MMT: able to stand without use of hands ?  ?MMT Right ?04/17/2021 Left ?04/17/2021  ?Hip flexion      ?Hip extension      ?Hip abduction      ?Hip adduction      ?Hip internal rotation      ?Hip external rotation      ?Knee flexion      ?Knee extension      ?Ankle dorsiflexion      ?Ankle plantarflexion      ?Ankle inversion      ?Ankle eversion      ? (Blank rows = not tested) ?  ?  ?  ?GAIT: ?  ?Comments: pt does not present with antalgic gait, slight forward flexion ?FLEXIBILITY: ? Able to maintain fully extended knee with trunk upright bilaterally with significantly more pull on the right v Lt ?  ?  ?  ?TODAY'S TREATMENT  ?4/3 ?Reviewed HEP and program. Organized HEP by stretches vs exercises. Patient advised on yoga days to just use stretches that help.  ? ?Supine: LTR x10 with cuing for technique  ?Supine gluteal: stretch 2x20 sec hold each leg reviewed how he can do in sitting  ? ?Supine exercises  ?Supine clamshell:  form HE x10 [place]  ?Supine march x10 each leg with education on progression ? ?Manual: tirgger point release to the lateral gluteal. Reviewed with the patient as well.  ? ? ? ?3/23: ?MANUAL: TPDN with monitoring and palpation followed by STM and stretching of TFL (Rt TFL, glut med/min) ?Standing- core+glut+lateral weight shift+breathing ? ?3/21: ?MANUAL: TPDN with skilled palpation and monitoring to Rt piriformis, glut med/min, TFL ?Seated figure 4 stretch bilateral ?SLS at counter with abd activation ?Gait training- trunk rotation, arm swing hip abd activation ? ?3/15: ?Manual: TPDN skilled palpation with monitoring and education (Rt hip external rotator group, glut med,min, max), STM to muscle groups following DN; passive hip flexion with slight adduction and AP mobilization, passive hamstring stretching center and lateral bias ?Seated HSS ?Seated piriformis stretch ?Standing hip flexor stretch ? ? ? ?  ?  ?PATIENT EDUCATION:  ?Education details: exercise form/rationale, edema & history review ?Person educated: Patient ?Education method: Explanation, Demonstration, Tactile cues, Verbal cues, and Handouts ?Education comprehension: verbalized understanding, returned demonstration, verbal cues required, tactile cues required, and needs further education ?  ?  ?HOME EXERCISE PROGRAM: ?[id number] ?  ?ASSESSMENT: ?  ?CLINICAL IMPRESSION: ?The patient tolerated treatment well. He was advised, if he is not having pain, to not put himself into pain. He had no pain with the exercises performed. He is doing well with Yoga. He was advised, if that works for him to continue with the yoga. He will do his other exercises on days when he is not doing yoga. We will continue needling PRN as well as trigger point release.  ? ?  ?  ?OBJECTIVE IMPAIRMENTS decreased activity tolerance, difficulty walking, decreased strength, increased muscle spasms, impaired flexibility, improper body mechanics, and pain.  ?  ?ACTIVITY LIMITATIONS  cleaning, community activity, meal prep, shopping, and physical fitness .  ?  ?PERSONAL FACTORS 1 comorbidity: h/o hernia repair  are also affecting patient's functional outcome.  ?  ?  ?REHAB POTENTIAL: Good ?  ?CLINICAL DECISION MAKING: Stable/uncomplicated ?  ?EVALUATION COMPLEXITY: Low ?  ?  ?GOALS: ?Goals reviewed with patient? Yes ?  ?  ?LONG TERM GOALS: ?  ?Pt will be able to stand from chairs without UE assist ?Baseline: required due to pain at  eval ?Target date: 05/15/2021 ?Goal status: INITIAL ?  ?2.  Pt will return to a walking program progression for exercise without limitation by SIJ pain ?Baseline: limited right now due to pain ?Target date: 05/15/2021 ?Goal status: INITIAL ?  ?3.  Independent with long term HEP ?Baseline: will progress as appropriate ?Target date: 05/15/2021 ?Goal status: INITIAL ?  ?  ?PLAN: ?PT FREQUENCY: 1-2x/week ?  ?PT DURATION: 4 weeks ?  ?PLANNED INTERVENTIONS: Therapeutic exercises, Therapeutic activity, Neuromuscular re-education, Balance training, Gait training, Patient/Family education, Joint mobilization, Stair training, Aquatic Therapy, Dry Needling, Electrical stimulation, Spinal mobilization, Cryotherapy, Moist heat, Taping, and Manual therapy ?  ?PLAN FOR NEXT SESSION: DN PRN, review gait, step ups ?  ? ?Carolyne Littles PT DPT  ?05/18/21 12:43 PM ? ?  ? ?

## 2021-05-19 DIAGNOSIS — R350 Frequency of micturition: Secondary | ICD-10-CM | POA: Diagnosis not present

## 2021-05-19 DIAGNOSIS — R609 Edema, unspecified: Secondary | ICD-10-CM | POA: Diagnosis not present

## 2021-05-21 ENCOUNTER — Ambulatory Visit (HOSPITAL_BASED_OUTPATIENT_CLINIC_OR_DEPARTMENT_OTHER): Payer: Medicare Other | Admitting: Physical Therapy

## 2021-05-21 ENCOUNTER — Encounter (HOSPITAL_BASED_OUTPATIENT_CLINIC_OR_DEPARTMENT_OTHER): Payer: Self-pay | Admitting: Physical Therapy

## 2021-05-21 DIAGNOSIS — M533 Sacrococcygeal disorders, not elsewhere classified: Secondary | ICD-10-CM | POA: Diagnosis not present

## 2021-05-21 DIAGNOSIS — M62830 Muscle spasm of back: Secondary | ICD-10-CM

## 2021-05-21 NOTE — Therapy (Signed)
?OUTPATIENT PHYSICAL THERAPY TREATMENT NOTE ? ? ?Patient Name: [name] ?MRN: [id number] ?DOB:August 24, 1932, 88 y.o., male ?Today's Date: 05/21/2021 ? ?PCP: Josetta Huddle, MD ?REFERRING PROVIDER: Josetta Huddle, MD ? ? PT End of Session - 05/21/21 0930   ? ? Visit Number 8   ? Number of Visits 15   ? Date for PT Re-Evaluation 06/12/21   ? Authorization Type MCR   ? PT Start Time 0930   ? PT Stop Time 0955   ? PT Time Calculation (min) 25 min   ? Activity Tolerance Patient tolerated treatment well   ? Behavior During Therapy [facility] for tasks assessed/performed   ? ?  ?  ? ?  ? ? ? ? ? ?Past Medical History:  ?Diagnosis Date  ? Asthma   ? Cancer [facility])   ? prostate with seeds  ? Coronary artery disease   ? Diabetes mellitus   ? High cholesterol   ? ?Past Surgical History:  ?Procedure Laterality Date  ? CATARACT EXTRACTION W/PHACO Right 1998  ? Dr. Karna Christmas  ? CATARACT EXTRACTION W/PHACO Left 2003  ? Dr. Davonna Belling  ? CATARACT EXTRACTION, BILATERAL  1998, 2002  ? CORONARY ARTERY BYPASS GRAFT    ? HERNIA REPAIR    ? INSERTION PROSTATE RADIATION SEED    ? LEG SURGERY    ? ?Patient Active Problem List  ? Diagnosis Date Noted  ? Vitreous membranes or strands, left 03/26/2021  ? Posterior vitreous detachment of both eyes 03/26/2021  ? Obstructive sleep apnea hypopnea, moderate 06/26/2020  ? Cholesterol retinal embolus of left eye 06/05/2019  ? Diabetes mellitus without complication ([place]) 00/86/7619  ? Extrinsic asthma with exacerbation 06/05/2019  ? Cough variant asthma vs uacs 10/06/2017  ? Hematuria 09/30/2011  ? Hyponatremia 09/30/2011  ? Acute on chronic renal failure ([place]) 09/30/2011  ? Leukocytosis 09/30/2011  ? Proteinuria 09/30/2011  ? ? ?REFERRING DIAG: M53.3 (ICD-10-CM) - Sacrococcygeal disorders, not elsewhere classified ? ?THERAPY DIAG:  ?Sacrococcygeal disorders, not elsewhere classified ? ?Muscle spasm of back ? ?PERTINENT HISTORY: h/o hernia repair ? ?PRECAUTIONS: none ? ?SUBJECTIVE: The patient did his exercises  yesterday but had knee pain 3 hours after doing his exercises. He also has pitting edema this morning. He stopped taking his diuretic.  He was advised to contact his MD ASAP about the edema.  ? ?PAIN:  ?Are you having pain? None 4/3 ?NPRS scale: None  ?Pain location: lateral right hip, rubbing TFL ?Aggravating factors: SB to the Rt in seated ?Relieving factors: posture ? ? ?OBJECTIVE:  ?  ?POSTURE:  ?Decreased thoracic kyphosis & lumbar lordosis ?  ?PALPATION: ?Rt anterior innominate rotation ?  ?  ?LUMBARAROM/PROM: 3/23: able to maintain knee extension ?  ?A/PROM A/PROM  ?04/17/2021  ?Flexion  mid shin  ?Extension    ?Right lateral flexion Knee joint line, pain at Rt SIJ  ?Left lateral flexion 34m past knee joint line   ?Right rotation    ?Left rotation    ? (Blank rows = not tested) ?  ?LE AROM/PROM: ?  ?A/PROM Right ?04/17/2021 Left ?04/17/2021  ?Hip flexion      ?Hip extension      ?Hip abduction      ?Hip adduction      ?Hip internal rotation      ?Hip external rotation      ?Knee flexion      ?Knee extension      ?Ankle dorsiflexion      ?Ankle plantarflexion      ?  Ankle inversion      ?Ankle eversion      ? (Blank rows = not tested) ?  ?LE MMT: able to stand without use of hands ?  ?MMT Right ?04/17/2021 Left ?04/17/2021  ?Hip flexion      ?Hip extension      ?Hip abduction      ?Hip adduction      ?Hip internal rotation      ?Hip external rotation      ?Knee flexion      ?Knee extension      ?Ankle dorsiflexion      ?Ankle plantarflexion      ?Ankle inversion      ?Ankle eversion      ? (Blank rows = not tested) ?  ?  ?  ?GAIT: ?  ?Comments: pt does not present with antalgic gait, slight forward flexion ?FLEXIBILITY: ? Able to maintain fully extended knee with trunk upright bilaterally with significantly more pull on the right v Lt ?  ?  ?  ?TODAY'S TREATMENT  ?4/7:  ?Reviewed Patients HEP; reviewed which exercises could cause pain. He doesn't have any exercises that aprparticuallry aggressive with the knees   ? ?Reviewed quad set 3x10  ? ?Spent time talking to the patient about his program.  ? ?4/3 ?Reviewed HEP and program. Organized HEP by stretches vs exercises. Patient advised on yoga days to just use stretches that help.  ? ?Supine: LTR x10 with cuing for technique  ?Supine gluteal: stretch 2x20 sec hold each leg reviewed how he can do in sitting  ? ?Supine exercises  ?Supine clamshell: form HE x10 [place]  ?Supine march x10 each leg with education on progression ? ?Manual: tirgger point release to the lateral gluteal. Reviewed with the patient as well.  ? ? ? ?3/23: ?MANUAL: TPDN with monitoring and palpation followed by STM and stretching of TFL (Rt TFL, glut med/min) ?Standing- core+glut+lateral weight shift+breathing ? ?3/21: ?MANUAL: TPDN with skilled palpation and monitoring to Rt piriformis, glut med/min, TFL ?Seated figure 4 stretch bilateral ?SLS at counter with abd activation ?Gait training- trunk rotation, arm swing hip abd activation ? ?3/15: ?Manual: TPDN skilled palpation with monitoring and education (Rt hip external rotator group, glut med,min, max), STM to muscle groups following DN; passive hip flexion with slight adduction and AP mobilization, passive hamstring stretching center and lateral bias ?Seated HSS ?Seated piriformis stretch ?Standing hip flexor stretch ? ? ? ?  ?  ?PATIENT EDUCATION:  ?Education details: exercise form/rationale, edema & history review ?Person educated: Patient ?Education method: Explanation, Demonstration, Tactile cues, Verbal cues, and Handouts ?Education comprehension: verbalized understanding, returned demonstration, verbal cues required, tactile cues required, and needs further education ?  ?  ?HOME EXERCISE PROGRAM: ?[id number] ?  ?ASSESSMENT: ?  ?CLINICAL IMPRESSION: ?The patient declined to do his exercises today. He is going on a trip and is worried that the exercises are going to cause him more pain in his knee. It is difficult to tell which exercise caused his  knee pain especially because he didn't feel the pain during the exercises. He was given a quad set to work on at home for his knee pain. He was advised to ice but try to keep moving as much as he can. He responded well to tylenol. He will talk to his cardiac MD about his swelling.  ?  ?  ?OBJECTIVE IMPAIRMENTS decreased activity tolerance, difficulty walking, decreased strength, increased muscle spasms, impaired flexibility, improper body mechanics, and pain.  ?  ?  ACTIVITY LIMITATIONS cleaning, community activity, meal prep, shopping, and physical fitness .  ?  ?PERSONAL FACTORS 1 comorbidity: h/o hernia repair  are also affecting patient's functional outcome.  ?  ?  ?REHAB POTENTIAL: Good ?  ?CLINICAL DECISION MAKING: Stable/uncomplicated ?  ?EVALUATION COMPLEXITY: Low ?  ?  ?GOALS: ?Goals reviewed with patient? Yes ?  ?  ?LONG TERM GOALS: ?  ?Pt will be able to stand from chairs without UE assist ?Baseline: required due to pain at eval ?Target date: 05/15/2021 ?Goal status: INITIAL ?  ?2.  Pt will return to a walking program progression for exercise without limitation by SIJ pain ?Baseline: limited right now due to pain ?Target date: 05/15/2021 ?Goal status: INITIAL ?  ?3.  Independent with long term HEP ?Baseline: will progress as appropriate ?Target date: 05/15/2021 ?Goal status: INITIAL ?  ?  ?PLAN: ?PT FREQUENCY: 1-2x/week ?  ?PT DURATION: 4 weeks ?  ?PLANNED INTERVENTIONS: Therapeutic exercises, Therapeutic activity, Neuromuscular re-education, Balance training, Gait training, Patient/Family education, Joint mobilization, Stair training, Aquatic Therapy, Dry Needling, Electrical stimulation, Spinal mobilization, Cryotherapy, Moist heat, Taping, and Manual therapy ?  ?PLAN FOR NEXT SESSION: DN PRN, review gait, step ups ?  ? ?Carolyne Littles PT DPT  ?05/21/21 9:38 PM ? ?  ? ?

## 2021-05-22 ENCOUNTER — Encounter (HOSPITAL_BASED_OUTPATIENT_CLINIC_OR_DEPARTMENT_OTHER): Payer: Self-pay | Admitting: Physical Therapy

## 2021-06-04 DIAGNOSIS — Z20822 Contact with and (suspected) exposure to covid-19: Secondary | ICD-10-CM | POA: Diagnosis not present

## 2021-06-13 DIAGNOSIS — Z20822 Contact with and (suspected) exposure to covid-19: Secondary | ICD-10-CM | POA: Diagnosis not present

## 2021-06-14 DIAGNOSIS — U071 COVID-19: Secondary | ICD-10-CM | POA: Diagnosis not present

## 2021-06-14 DIAGNOSIS — R051 Acute cough: Secondary | ICD-10-CM | POA: Diagnosis not present

## 2021-06-15 ENCOUNTER — Ambulatory Visit (HOSPITAL_BASED_OUTPATIENT_CLINIC_OR_DEPARTMENT_OTHER): Payer: Medicare Other | Admitting: Physical Therapy

## 2021-06-17 ENCOUNTER — Ambulatory Visit (HOSPITAL_BASED_OUTPATIENT_CLINIC_OR_DEPARTMENT_OTHER): Payer: Medicare Other | Admitting: Physical Therapy

## 2021-06-22 ENCOUNTER — Ambulatory Visit (HOSPITAL_BASED_OUTPATIENT_CLINIC_OR_DEPARTMENT_OTHER): Payer: Medicare Other | Attending: Internal Medicine | Admitting: Physical Therapy

## 2021-06-22 DIAGNOSIS — M62830 Muscle spasm of back: Secondary | ICD-10-CM | POA: Insufficient documentation

## 2021-06-22 DIAGNOSIS — M533 Sacrococcygeal disorders, not elsewhere classified: Secondary | ICD-10-CM | POA: Insufficient documentation

## 2021-06-22 DIAGNOSIS — Z20822 Contact with and (suspected) exposure to covid-19: Secondary | ICD-10-CM | POA: Diagnosis not present

## 2021-06-24 ENCOUNTER — Encounter (HOSPITAL_BASED_OUTPATIENT_CLINIC_OR_DEPARTMENT_OTHER): Payer: Medicare Other | Admitting: Physical Therapy

## 2021-06-29 ENCOUNTER — Encounter (HOSPITAL_BASED_OUTPATIENT_CLINIC_OR_DEPARTMENT_OTHER): Payer: Medicare Other | Admitting: Physical Therapy

## 2021-07-01 ENCOUNTER — Encounter (HOSPITAL_BASED_OUTPATIENT_CLINIC_OR_DEPARTMENT_OTHER): Payer: Medicare Other | Admitting: Physical Therapy

## 2021-07-02 ENCOUNTER — Encounter (INDEPENDENT_AMBULATORY_CARE_PROVIDER_SITE_OTHER): Payer: Medicare Other | Admitting: Ophthalmology

## 2021-07-02 ENCOUNTER — Encounter (INDEPENDENT_AMBULATORY_CARE_PROVIDER_SITE_OTHER): Payer: Self-pay

## 2021-07-06 ENCOUNTER — Encounter (HOSPITAL_BASED_OUTPATIENT_CLINIC_OR_DEPARTMENT_OTHER): Payer: Self-pay | Admitting: Physical Therapy

## 2021-07-06 ENCOUNTER — Ambulatory Visit (HOSPITAL_BASED_OUTPATIENT_CLINIC_OR_DEPARTMENT_OTHER): Payer: Medicare Other | Admitting: Physical Therapy

## 2021-07-06 DIAGNOSIS — M533 Sacrococcygeal disorders, not elsewhere classified: Secondary | ICD-10-CM | POA: Diagnosis not present

## 2021-07-06 DIAGNOSIS — M62830 Muscle spasm of back: Secondary | ICD-10-CM | POA: Diagnosis not present

## 2021-07-06 NOTE — Therapy (Signed)
OUTPATIENT PHYSICAL THERAPY TREATMENT NOTE   Patient Name: [name] MRN: [id number] DOB:1932/10/01, 88 y.o., male Today's Date: 07/06/2021  PCP: Josetta Huddle, MD REFERRING PROVIDER: Josetta Huddle, MD   PT End of Session - 07/06/21 1146     Visit Number 9    Date for PT Re-Evaluation 07/06/21    Authorization Type MCR    Progress Note Due on Visit 10    PT Start Time 8295    PT Stop Time 1223    PT Time Calculation (min) 38 min    Activity Tolerance Patient tolerated treatment well    Behavior During Therapy WFL for tasks assessed/performed                 Past Medical History:  Diagnosis Date   Asthma    Cancer ([place])    prostate with seeds   Coronary artery disease    Diabetes mellitus    High cholesterol    Past Surgical History:  Procedure Laterality Date   CATARACT EXTRACTION W/PHACO Right 1998   Dr. Karna Christmas   CATARACT EXTRACTION W/PHACO Left 2003   Dr. Davonna Belling   CATARACT EXTRACTION, [place], 2002   CORONARY ARTERY BYPASS GRAFT     [place]     Patient Active Problem List   Diagnosis Date Noted   Vitreous membranes or strands, left 03/26/2021   Posterior vitreous detachment of both eyes 03/26/2021   Obstructive sleep apnea hypopnea, moderate 06/26/2020   Cholesterol retinal embolus of left eye 06/05/2019   Diabetes mellitus without complication ([place]) 62/13/0865   Extrinsic asthma with exacerbation 06/05/2019   Cough variant asthma vs uacs 10/06/2017   Hematuria 09/30/2011   Hyponatremia 09/30/2011   Acute on chronic renal failure ([place]) 09/30/2011   Leukocytosis 09/30/2011   Proteinuria 09/30/2011    REFERRING DIAG: M53.3 (ICD-10-CM) - Sacrococcygeal disorders, not elsewhere classified  THERAPY DIAG:  Sacrococcygeal disorders, not elsewhere classified  Muscle spasm of back  PERTINENT HISTORY: h/o hernia repair  PRECAUTIONS: none  Progress  Note/Re-Evaluation Reporting Period 04/17/21 to 07/06/2021   See note below for Objective Data and Assessment of Progress/Goals.      SUBJECTIVE: Overall I am just exhausted post COVID. The chair yoga that I did an hour ago was very strenuous.     PAIN:  Are you having pain? No   OBJECTIVE:    POSTURE:  Decreased thoracic kyphosis & lumbar lordosis       LUMBARAROM/PROM: 3/23: able to maintain knee extension   A/PROM A/PROM  04/17/2021  Flexion  mid shin  Extension    Right lateral flexion Knee joint line, pain at Rt SIJ  Left lateral flexion 6m past knee joint line   Right rotation    Left rotation     (Blank rows = not tested)      LE MMT: able to stand without use of hands   MMT Right 04/17/2021 Left 04/17/2021  Hip flexion      Hip extension      Hip abduction      Hip adduction      Hip internal rotation      Hip external rotation      Knee flexion      Knee extension      Ankle dorsiflexion      Ankle plantarflexion      Ankle inversion      Ankle eversion       (  Blank rows = not tested)       GAIT:   Comments: pt does not present with antalgic gait, slight forward flexion FLEXIBILITY:  Able to maintain fully extended knee with trunk upright bilaterally with significantly more pull on the right v Lt       TODAY'S TREATMENT  5/22: See plan    4/7:  Reviewed Patients HEP; reviewed which exercises could cause pain. He doesn't have any exercises that aprparticuallry aggressive with the knees   Reviewed quad set 3x10   Spent time talking to the patient about his program.         PATIENT EDUCATION:  Education details: return to function, POC, HEP Person educated: Patient Education method: Explanation, Demonstration, Tactile cues, Verbal cues, and Handouts Education comprehension: verbalized understanding, returned demonstration, verbal cues required, tactile cues required, and needs further education     HOME EXERCISE  PROGRAM: [id number]   ASSESSMENT:   CLINICAL IMPRESSION: Pt returns to PT today for re-evaluation. He was diagnosed with COVID-19 and is experiencing fatigue following. At this time his SIJ and lower back are not bothering him and does not require treatment at this time. We worked together to create a daily schedule with emphasis on rest and exercise to avoid being "knocked out" by a level of activity. Reviewed the importance of nutrition and fluid intake to support body function. Pt will keep an activity log to assist in determining level of tolerance in daily activities. Will be placed on hold and advised that his referral is good for 6 mo.  Encouraged him to contact me with any further questions.        OBJECTIVE IMPAIRMENTS decreased activity tolerance, difficulty walking, decreased strength, increased muscle spasms, impaired flexibility, improper body mechanics, and pain.    ACTIVITY LIMITATIONS cleaning, community activity, meal prep, shopping, and physical fitness .    PERSONAL FACTORS 1 comorbidity: h/o hernia repair  are also affecting patient's functional outcome.      REHAB POTENTIAL: Good   CLINICAL DECISION MAKING: Stable/uncomplicated   EVALUATION COMPLEXITY: Low     GOALS: Goals reviewed with patient? Yes     LONG TERM GOALS:   Pt will be able to stand from chairs without UE assist Baseline: required due to pain at eval Target date: 05/15/2021 Goal status: achieved   2.  Pt will return to a walking program progression for exercise without limitation by SIJ pain Baseline: limited right now due to pain Target date: 05/15/2021 Goal status: achieved   3.  Independent with long term HEP Baseline: will progress as appropriate Target date: 05/15/2021 Goal status: achieved     PLAN: PT FREQUENCY: 1-2x/week   PT DURATION: 4 weeks   PLANNED INTERVENTIONS: Therapeutic exercises, Therapeutic activity, Neuromuscular re-education, Balance training, Gait training,  Patient/Family education, Joint mobilization, Stair training, Aquatic Therapy, Dry Needling, Electrical stimulation, Spinal mobilization, Cryotherapy, Moist heat, Taping, and Manual therapy   PLAN FOR NEXT SESSION: re-evaluate PRN.  Avilene Marrin C. Relda Agosto PT, DPT 07/06/21 8:25 PM

## 2021-07-08 ENCOUNTER — Ambulatory Visit (HOSPITAL_BASED_OUTPATIENT_CLINIC_OR_DEPARTMENT_OTHER): Payer: Medicare Other | Admitting: Physical Therapy

## 2021-07-15 ENCOUNTER — Encounter (HOSPITAL_BASED_OUTPATIENT_CLINIC_OR_DEPARTMENT_OTHER): Payer: Medicare Other | Admitting: Physical Therapy

## 2021-07-15 DIAGNOSIS — J45909 Unspecified asthma, uncomplicated: Secondary | ICD-10-CM | POA: Diagnosis not present

## 2021-07-15 DIAGNOSIS — E119 Type 2 diabetes mellitus without complications: Secondary | ICD-10-CM | POA: Diagnosis not present

## 2021-07-15 DIAGNOSIS — I10 Essential (primary) hypertension: Secondary | ICD-10-CM | POA: Diagnosis not present

## 2021-07-15 DIAGNOSIS — E782 Mixed hyperlipidemia: Secondary | ICD-10-CM | POA: Diagnosis not present

## 2021-07-17 ENCOUNTER — Encounter (HOSPITAL_BASED_OUTPATIENT_CLINIC_OR_DEPARTMENT_OTHER): Payer: Medicare Other | Admitting: Physical Therapy

## 2021-09-01 DIAGNOSIS — J301 Allergic rhinitis due to pollen: Secondary | ICD-10-CM | POA: Diagnosis not present

## 2021-09-01 DIAGNOSIS — J3089 Other allergic rhinitis: Secondary | ICD-10-CM | POA: Diagnosis not present

## 2021-09-01 DIAGNOSIS — L299 Pruritus, unspecified: Secondary | ICD-10-CM | POA: Diagnosis not present

## 2021-09-01 DIAGNOSIS — J4540 Moderate persistent asthma, uncomplicated: Secondary | ICD-10-CM | POA: Diagnosis not present

## 2021-09-18 ENCOUNTER — Other Ambulatory Visit (HOSPITAL_BASED_OUTPATIENT_CLINIC_OR_DEPARTMENT_OTHER): Payer: Self-pay

## 2021-09-18 MED ORDER — ESOMEPRAZOLE MAGNESIUM 40 MG PO CPDR
DELAYED_RELEASE_CAPSULE | ORAL | 0 refills | Status: DC
  Filled 2021-09-18: qty 90, 90d supply, fill #0

## 2021-10-12 ENCOUNTER — Other Ambulatory Visit (HOSPITAL_BASED_OUTPATIENT_CLINIC_OR_DEPARTMENT_OTHER): Payer: Self-pay

## 2021-10-22 DIAGNOSIS — C61 Malignant neoplasm of prostate: Secondary | ICD-10-CM | POA: Diagnosis not present

## 2021-10-29 DIAGNOSIS — R3915 Urgency of urination: Secondary | ICD-10-CM | POA: Diagnosis not present

## 2021-10-29 DIAGNOSIS — R351 Nocturia: Secondary | ICD-10-CM | POA: Diagnosis not present

## 2021-10-29 DIAGNOSIS — C61 Malignant neoplasm of prostate: Secondary | ICD-10-CM | POA: Diagnosis not present

## 2021-10-29 DIAGNOSIS — R350 Frequency of micturition: Secondary | ICD-10-CM | POA: Diagnosis not present

## 2021-11-09 DIAGNOSIS — K219 Gastro-esophageal reflux disease without esophagitis: Secondary | ICD-10-CM | POA: Diagnosis not present

## 2021-11-09 DIAGNOSIS — R151 Fecal smearing: Secondary | ICD-10-CM | POA: Diagnosis not present

## 2021-11-09 DIAGNOSIS — E119 Type 2 diabetes mellitus without complications: Secondary | ICD-10-CM | POA: Diagnosis not present

## 2021-11-09 DIAGNOSIS — R609 Edema, unspecified: Secondary | ICD-10-CM | POA: Diagnosis not present

## 2021-11-09 DIAGNOSIS — R413 Other amnesia: Secondary | ICD-10-CM | POA: Diagnosis not present

## 2021-11-09 DIAGNOSIS — E782 Mixed hyperlipidemia: Secondary | ICD-10-CM | POA: Diagnosis not present

## 2021-11-09 DIAGNOSIS — Z0000 Encounter for general adult medical examination without abnormal findings: Secondary | ICD-10-CM | POA: Diagnosis not present

## 2021-11-09 DIAGNOSIS — R350 Frequency of micturition: Secondary | ICD-10-CM | POA: Diagnosis not present

## 2021-11-09 DIAGNOSIS — F418 Other specified anxiety disorders: Secondary | ICD-10-CM | POA: Diagnosis not present

## 2021-11-09 DIAGNOSIS — G4733 Obstructive sleep apnea (adult) (pediatric): Secondary | ICD-10-CM | POA: Diagnosis not present

## 2021-11-09 DIAGNOSIS — Z23 Encounter for immunization: Secondary | ICD-10-CM | POA: Diagnosis not present

## 2021-11-09 DIAGNOSIS — E559 Vitamin D deficiency, unspecified: Secondary | ICD-10-CM | POA: Diagnosis not present

## 2021-11-09 DIAGNOSIS — I10 Essential (primary) hypertension: Secondary | ICD-10-CM | POA: Diagnosis not present

## 2021-11-09 DIAGNOSIS — I2510 Atherosclerotic heart disease of native coronary artery without angina pectoris: Secondary | ICD-10-CM | POA: Diagnosis not present

## 2021-12-10 DIAGNOSIS — I2510 Atherosclerotic heart disease of native coronary artery without angina pectoris: Secondary | ICD-10-CM | POA: Diagnosis not present

## 2021-12-10 DIAGNOSIS — R4189 Other symptoms and signs involving cognitive functions and awareness: Secondary | ICD-10-CM | POA: Diagnosis not present

## 2021-12-10 DIAGNOSIS — E119 Type 2 diabetes mellitus without complications: Secondary | ICD-10-CM | POA: Diagnosis not present

## 2021-12-10 DIAGNOSIS — Z23 Encounter for immunization: Secondary | ICD-10-CM | POA: Diagnosis not present

## 2021-12-23 DIAGNOSIS — L570 Actinic keratosis: Secondary | ICD-10-CM | POA: Diagnosis not present

## 2021-12-23 DIAGNOSIS — D692 Other nonthrombocytopenic purpura: Secondary | ICD-10-CM | POA: Diagnosis not present

## 2021-12-23 DIAGNOSIS — D2239 Melanocytic nevi of other parts of face: Secondary | ICD-10-CM | POA: Diagnosis not present

## 2021-12-23 DIAGNOSIS — D225 Melanocytic nevi of trunk: Secondary | ICD-10-CM | POA: Diagnosis not present

## 2021-12-23 DIAGNOSIS — D485 Neoplasm of uncertain behavior of skin: Secondary | ICD-10-CM | POA: Diagnosis not present

## 2021-12-23 DIAGNOSIS — Z85828 Personal history of other malignant neoplasm of skin: Secondary | ICD-10-CM | POA: Diagnosis not present

## 2021-12-23 DIAGNOSIS — C44329 Squamous cell carcinoma of skin of other parts of face: Secondary | ICD-10-CM | POA: Diagnosis not present

## 2021-12-23 DIAGNOSIS — C44319 Basal cell carcinoma of skin of other parts of face: Secondary | ICD-10-CM | POA: Diagnosis not present

## 2021-12-23 DIAGNOSIS — L821 Other seborrheic keratosis: Secondary | ICD-10-CM | POA: Diagnosis not present

## 2021-12-31 DIAGNOSIS — G4733 Obstructive sleep apnea (adult) (pediatric): Secondary | ICD-10-CM | POA: Diagnosis not present

## 2021-12-31 DIAGNOSIS — I2510 Atherosclerotic heart disease of native coronary artery without angina pectoris: Secondary | ICD-10-CM | POA: Diagnosis not present

## 2022-01-28 DIAGNOSIS — I2510 Atherosclerotic heart disease of native coronary artery without angina pectoris: Secondary | ICD-10-CM | POA: Diagnosis not present

## 2022-01-28 DIAGNOSIS — G4733 Obstructive sleep apnea (adult) (pediatric): Secondary | ICD-10-CM | POA: Diagnosis not present

## 2022-03-04 DIAGNOSIS — C44329 Squamous cell carcinoma of skin of other parts of face: Secondary | ICD-10-CM | POA: Diagnosis not present

## 2022-03-04 DIAGNOSIS — Z85828 Personal history of other malignant neoplasm of skin: Secondary | ICD-10-CM | POA: Diagnosis not present

## 2022-03-11 DIAGNOSIS — Z85828 Personal history of other malignant neoplasm of skin: Secondary | ICD-10-CM | POA: Diagnosis not present

## 2022-03-11 DIAGNOSIS — C44319 Basal cell carcinoma of skin of other parts of face: Secondary | ICD-10-CM | POA: Diagnosis not present

## 2022-03-18 DIAGNOSIS — L570 Actinic keratosis: Secondary | ICD-10-CM | POA: Diagnosis not present

## 2022-03-24 DIAGNOSIS — J3089 Other allergic rhinitis: Secondary | ICD-10-CM | POA: Diagnosis not present

## 2022-03-24 DIAGNOSIS — L299 Pruritus, unspecified: Secondary | ICD-10-CM | POA: Diagnosis not present

## 2022-03-24 DIAGNOSIS — J4540 Moderate persistent asthma, uncomplicated: Secondary | ICD-10-CM | POA: Diagnosis not present

## 2022-03-24 DIAGNOSIS — J301 Allergic rhinitis due to pollen: Secondary | ICD-10-CM | POA: Diagnosis not present

## 2022-03-30 DIAGNOSIS — R5382 Chronic fatigue, unspecified: Secondary | ICD-10-CM | POA: Diagnosis not present

## 2022-03-30 DIAGNOSIS — I10 Essential (primary) hypertension: Secondary | ICD-10-CM | POA: Diagnosis not present

## 2022-03-30 DIAGNOSIS — E119 Type 2 diabetes mellitus without complications: Secondary | ICD-10-CM | POA: Diagnosis not present

## 2022-03-30 DIAGNOSIS — E559 Vitamin D deficiency, unspecified: Secondary | ICD-10-CM | POA: Diagnosis not present

## 2022-03-30 DIAGNOSIS — J45909 Unspecified asthma, uncomplicated: Secondary | ICD-10-CM | POA: Diagnosis not present

## 2022-04-01 DIAGNOSIS — R350 Frequency of micturition: Secondary | ICD-10-CM | POA: Diagnosis not present

## 2022-04-07 DIAGNOSIS — D692 Other nonthrombocytopenic purpura: Secondary | ICD-10-CM | POA: Diagnosis not present

## 2022-04-07 DIAGNOSIS — L298 Other pruritus: Secondary | ICD-10-CM | POA: Diagnosis not present

## 2022-04-07 DIAGNOSIS — Z85828 Personal history of other malignant neoplasm of skin: Secondary | ICD-10-CM | POA: Diagnosis not present

## 2022-04-18 DIAGNOSIS — G4761 Periodic limb movement disorder: Secondary | ICD-10-CM | POA: Diagnosis not present

## 2022-04-18 DIAGNOSIS — G4733 Obstructive sleep apnea (adult) (pediatric): Secondary | ICD-10-CM | POA: Diagnosis not present

## 2022-04-21 DIAGNOSIS — L298 Other pruritus: Secondary | ICD-10-CM | POA: Diagnosis not present

## 2022-04-21 DIAGNOSIS — D692 Other nonthrombocytopenic purpura: Secondary | ICD-10-CM | POA: Diagnosis not present

## 2022-04-21 DIAGNOSIS — Z85828 Personal history of other malignant neoplasm of skin: Secondary | ICD-10-CM | POA: Diagnosis not present

## 2022-04-28 ENCOUNTER — Encounter (INDEPENDENT_AMBULATORY_CARE_PROVIDER_SITE_OTHER): Payer: Medicare Other | Admitting: Ophthalmology

## 2022-04-29 ENCOUNTER — Encounter (INDEPENDENT_AMBULATORY_CARE_PROVIDER_SITE_OTHER): Payer: Medicare Other | Admitting: Ophthalmology

## 2022-05-05 DIAGNOSIS — I8391 Asymptomatic varicose veins of right lower extremity: Secondary | ICD-10-CM | POA: Diagnosis not present

## 2022-05-05 DIAGNOSIS — Z85828 Personal history of other malignant neoplasm of skin: Secondary | ICD-10-CM | POA: Diagnosis not present

## 2022-05-05 DIAGNOSIS — L298 Other pruritus: Secondary | ICD-10-CM | POA: Diagnosis not present

## 2022-05-05 DIAGNOSIS — I8392 Asymptomatic varicose veins of left lower extremity: Secondary | ICD-10-CM | POA: Diagnosis not present

## 2022-05-06 DIAGNOSIS — G4733 Obstructive sleep apnea (adult) (pediatric): Secondary | ICD-10-CM | POA: Diagnosis not present

## 2022-05-06 DIAGNOSIS — E119 Type 2 diabetes mellitus without complications: Secondary | ICD-10-CM | POA: Diagnosis not present

## 2022-05-06 DIAGNOSIS — H43312 Vitreous membranes and strands, left eye: Secondary | ICD-10-CM | POA: Diagnosis not present

## 2022-05-06 DIAGNOSIS — H43813 Vitreous degeneration, bilateral: Secondary | ICD-10-CM | POA: Diagnosis not present

## 2022-05-12 DIAGNOSIS — E119 Type 2 diabetes mellitus without complications: Secondary | ICD-10-CM | POA: Diagnosis not present

## 2022-05-12 DIAGNOSIS — R609 Edema, unspecified: Secondary | ICD-10-CM | POA: Diagnosis not present

## 2022-05-12 DIAGNOSIS — L299 Pruritus, unspecified: Secondary | ICD-10-CM | POA: Diagnosis not present

## 2022-05-12 DIAGNOSIS — J4540 Moderate persistent asthma, uncomplicated: Secondary | ICD-10-CM | POA: Diagnosis not present

## 2022-05-12 DIAGNOSIS — I10 Essential (primary) hypertension: Secondary | ICD-10-CM | POA: Diagnosis not present

## 2022-05-29 DIAGNOSIS — Z23 Encounter for immunization: Secondary | ICD-10-CM | POA: Diagnosis not present

## 2022-07-02 DIAGNOSIS — J4540 Moderate persistent asthma, uncomplicated: Secondary | ICD-10-CM | POA: Diagnosis not present

## 2022-07-02 DIAGNOSIS — J40 Bronchitis, not specified as acute or chronic: Secondary | ICD-10-CM | POA: Diagnosis not present

## 2022-07-02 DIAGNOSIS — R600 Localized edema: Secondary | ICD-10-CM | POA: Diagnosis not present

## 2022-07-21 DIAGNOSIS — R5383 Other fatigue: Secondary | ICD-10-CM | POA: Diagnosis not present

## 2022-07-21 DIAGNOSIS — I872 Venous insufficiency (chronic) (peripheral): Secondary | ICD-10-CM | POA: Diagnosis not present

## 2022-07-21 DIAGNOSIS — I2510 Atherosclerotic heart disease of native coronary artery without angina pectoris: Secondary | ICD-10-CM | POA: Diagnosis not present

## 2022-07-21 DIAGNOSIS — R059 Cough, unspecified: Secondary | ICD-10-CM | POA: Diagnosis not present

## 2022-07-21 DIAGNOSIS — M7989 Other specified soft tissue disorders: Secondary | ICD-10-CM | POA: Diagnosis not present

## 2022-07-22 ENCOUNTER — Other Ambulatory Visit: Payer: Self-pay | Admitting: Internal Medicine

## 2022-07-22 ENCOUNTER — Ambulatory Visit
Admission: RE | Admit: 2022-07-22 | Discharge: 2022-07-22 | Disposition: A | Discharge status: Home / Self Care | Payer: Medicare Other | Source: Ambulatory Visit | Attending: Internal Medicine | Admitting: Internal Medicine

## 2022-07-22 DIAGNOSIS — R058 Other specified cough: Secondary | ICD-10-CM

## 2022-07-22 DIAGNOSIS — J9811 Atelectasis: Secondary | ICD-10-CM | POA: Diagnosis not present

## 2022-07-22 DIAGNOSIS — R059 Cough, unspecified: Secondary | ICD-10-CM | POA: Diagnosis not present

## 2022-07-22 LAB — LAB REPORT - SCANNED: EGFR: 72

## 2022-07-29 ENCOUNTER — Encounter: Payer: Self-pay | Admitting: Internal Medicine

## 2022-07-29 ENCOUNTER — Ambulatory Visit: Payer: Medicare Other | Attending: Cardiovascular Disease | Admitting: Internal Medicine

## 2022-07-29 VITALS — BP 146/80 | HR 58 | Ht 68.0 in | Wt 150.4 lb

## 2022-07-29 DIAGNOSIS — R600 Localized edema: Secondary | ICD-10-CM

## 2022-07-29 NOTE — Progress Notes (Signed)
Cardiology Office Note:    Date:  07/29/2022   ID:  [name], DOB [birth year], MRN [id number]  PCP:  Georgann Housekeeper, MD   [facility] Providers Cardiologist:  None     Referring MD: Georgann Housekeeper, MD   No chief complaint on file. LE edema  History of Present Illness:    [name] is a 89 y.o. male with a hx of asthma, prostate CA,  prior PCI unknown vessel, in ~2008 at [facility], DM2, no cardiac dx hx, referral for an acute visit fo BL ankle swelling.  He had low BNP (61) drawn by her PCP Dr. Donette Larry.  Normal TSH.  Echo in 2017 normal for age, mild MR. He came back from a vacation with 27 hours of travel. He was started on lasix with improved his mild edema. This all occurred after his trip. Concerned because if he stops lasix his swelling can increase.  He has not been active since he returned from his trip about a month ago. He is more fatigued.  No CP, No SOB. No orthopnea/PND. Sleep apnea on CPAP machine He is a retired Clinical research associate   Past Medical History:  Diagnosis Date   Asthma    Cancer ([facility])    prostate with seeds   Coronary artery disease    Diabetes mellitus    High cholesterol     Past Surgical History:  Procedure Laterality Date   CATARACT EXTRACTION W/PHACO Right 1998   Dr. Stephens Shire   CATARACT EXTRACTION W/PHACO Left 2003   Dr. Vanice Sarah   CATARACT EXTRACTION, BILATERAL  1998, 2002   CORONARY ARTERY BYPASS GRAFT     HERNIA REPAIR     INSERTION PROSTATE RADIATION SEED     LEG SURGERY      Current Medications: Current Outpatient Medications on File Prior to Visit  Medication Sig Dispense Refill   albuterol (PROVENTIL HFA;VENTOLIN HFA) 108 (90 BASE) MCG/ACT inhaler Inhale 2 puffs into the lungs every 6 (six) hours as needed. For shortness of breath.     aspirin EC 81 MG tablet Take 81 mg by mouth daily.     atorvastatin (LIPITOR) 40 MG tablet Take 40 mg by mouth daily.     Blood Glucose Monitoring Suppl (IGLUCOSE MONITORING SYSTEM) w/Device KIT  [id number]     Cholecalciferol (VITAMIN D-1000 MAX ST PO) Take 3,000 mg by mouth.     Coenzyme Q10 (COQ10) 400 MG CAPS Take by mouth.     esomeprazole (NEXIUM) 40 MG capsule TAKE 1 CAPSULE BY MOUTH  DAILY IN THE MORNING 90 capsule 0   famotidine (PEPCID) 20 MG tablet Take 2 tablets every evening as directed (Patient taking differently: Take 20 mg by mouth at bedtime. Take 2 tablets every evening as directed) 60 tablet 5   furosemide (LASIX) 20 MG tablet Take 20 mg by mouth daily.     IGLUCOSE TEST STRIPS test strip [id number]     Lancets (STERILANCE TL) MISC [id number]     levocetirizine (XYZAL) 5 MG tablet Take 5 mg by mouth in the morning and at bedtime.     metFORMIN (GLUCOPHAGE) 500 MG tablet Take 500-1,000 mg by mouth at bedtime. Take 500 mg every morning Take 1000 mg at bedtime     mometasone-formoterol (DULERA) 200-5 MCG/ACT AERO Inhale 2 puffs twice daily with spacer to prevent cough/wheeze. Rinse, gargle and spit after each use. 13 g 5   potassium chloride (KLOR-CON) 8 MEQ tablet Take 8 mEq by mouth daily.  vitamin B-12 (CYANOCOBALAMIN) 1000 MCG tablet Take 3,000 mcg by mouth daily.     fexofenadine (ALLEGRA) 180 MG tablet Take 180 mg by mouth. Takes 2 tablets in the morning, 1 tablet 6 hours later, then 2 tablets 8 hours later. (Patient not taking: Reported on 07/29/2022)     FLOVENT HFA 110 MCG/ACT inhaler  (Patient not taking: Reported on 07/29/2022)     No current facility-administered medications on file prior to visit.     Allergies:   Lisinopril and Sulfa antibiotics   Social History   Socioeconomic History   Marital status: Married    Spouse name: Not on file   Number of children: 2   Years of education: MD   Highest education level: Not on file  Occupational History   Occupation: [employer]  Tobacco Use   Smoking status: Never   Smokeless tobacco: Never  Vaping Use   Vaping Use: Never used  Substance and Sexual Activity   Alcohol use: Yes    Alcohol/week: 10.0 standard  drinks of alcohol    Types: 10 Standard drinks or equivalent per week    Comment: 1-2 drinks 4-5 days per week   Drug use: No   Sexual activity: Not on file  Other Topics Concern   Not on file  Social History Narrative   Lives at home w/ his wife   Right-handed   Caffeine: 3-4 cups per day   Social Determinants of Health   Financial Resource Strain: Not on file  Food Insecurity: Not on file  Transportation Needs: Not on file  Physical Activity: Not on file  Stress: Not on file  Social Connections: Not on file     Family History: The patient's family history includes Colon cancer in his paternal grandfather; Heart attack in his mother; Prostate cancer in his father. There is no history of Allergic rhinitis, Angioedema, Asthma, Atopy, Eczema, Immunodeficiency, or Urticaria.  ROS:   Please see the history of present illness.     All other systems reviewed and are negative.  EKGs/Labs/Other Studies Reviewed:    The following studies were reviewed today:   EKG:  EKG is  ordered today.  The ekg ordered today demonstrates   07/29/2022- sinus bradycardia HR 53 bpm  Recent Labs: No results found for requested labs within last 365 days.   Recent Lipid Panel No results found for: "CHOL", "TRIG", "HDL", "CHOLHDL", "VLDL", "LDLCALC", "LDLDIRECT"   Risk Assessment/Calculations:     Physical Exam:    VS:   Vitals:   07/29/22 1032  BP: (!) 146/80  Pulse: (!) 58  SpO2: 96%     Wt Readings from Last 3 Encounters:  10/10/18 163 lb 12.8 oz (74.3 kg)  12/15/17 170 lb (77.1 kg)  11/03/17 165 lb 6.4 oz (75 kg)     GEN:  Well nourished, well developed in no acute distress HEENT: Normal NECK: No JVD; No carotid bruits CARDIAC: RRR, II/VI SEM RUSB RESPIRATORY:  Clear to auscultation without rales, wheezing or rhonchi  ABDOMEN: Soft, non-tender, non-distended MUSCULOSKELETAL:  trace to 1+ pitting in the foot; No deformity  SKIN: Warm and dry NEUROLOGIC:  Alert and  oriented x 3 PSYCHIATRIC:  Normal affect   ASSESSMENT:   Dependant Edema: likely dependant edema with no pnd/orthopnea and normal BNP. Discussed considering more activity and wearing his compression stockings. To be comprehensive will get a TTE. He can continue lasix for now will assess renal function in 2 weeks.  PLAN:    In order  of problems listed above:  TTE BMET in 2 weeks on lasix FU in 1 month           Medication Adjustments/Labs and Tests Ordered: Current medicines are reviewed at length with the patient today.  Concerns regarding medicines are outlined above.  No orders of the defined types were placed in this encounter.  No orders of the defined types were placed in this encounter.   There are no Patient Instructions on file for this visit.   Signed, Maisie Fus, MD  07/29/2022 10:19 AM    [facility]

## 2022-07-29 NOTE — Patient Instructions (Signed)
Medication Instructions:  The patient is currently on no regular medications.  *If you need a refill on your cardiac medications before your next appointment, please call your pharmacy*   Lab Work: Labs will be drawn in 2 weeks  If you have labs (blood work) drawn today and your tests are completely normal, you will receive your results only by: MyChart Message (if you have MyChart) OR A paper copy in the mail If you have any lab test that is abnormal or we need to change your treatment, we will call you to review the results.  Please return for lab -  BMET. You do not have to fast for this test  Our in office lab hours are Monday-Friday 8:00-4:00, closed for lunch 12:45-1:45 pm.  No appointment needed.  [facility] locations:   [facility] - [address]  - [address] ([facility]) - [address] [phone] [address]   [place] - [address]    [place]  - [address]    [place] - [address] - [address] [employer] [facility]  - [address] - [address] Technology [address])    Testing/Procedures: Your physician has requested that you have an echocardiogram. Echocardiography is a painless test that uses sound waves to create images of your heart. It provides your doctor with information about the size and shape of your heart and how well your heart's chambers and valves are working. This procedure takes approximately one hour. There are no restrictions for this procedure. Please do NOT wear cologne, perfume, aftershave, or lotions (deodorant is allowed). Please arrive 15 minutes prior to your appointment time.    Follow-Up: At [facility], you and your health needs are our priority.  As part of our continuing mission to provide you with exceptional heart care, we have created designated Provider Care Teams.  These Care Teams  include your primary Cardiologist (physician) and Advanced Practice Providers (APPs -  Physician Assistants and Nurse Practitioners) who all work together to provide you with the care you need, when you need it.  We recommend signing up for the patient portal called "MyChart".  Sign up information is provided on this After Visit Summary.  MyChart is used to connect with patients for Virtual Visits (Telemedicine).  Patients are able to view lab/test results, encounter notes, upcoming appointments, etc.  Non-urgent messages can be sent to your provider as well.   To learn more about what you can do with MyChart, go to [email or web address].    Your next appointment:   1 month(s)  Provider:   Maisie Fus, MD

## 2022-07-30 NOTE — Addendum Note (Signed)
Addended by: Corey Harold T on: 07/30/2022 09:03 AM   Modules accepted: Orders

## 2022-08-05 ENCOUNTER — Ambulatory Visit: Payer: Self-pay | Admitting: Cardiology

## 2022-08-12 DIAGNOSIS — R600 Localized edema: Secondary | ICD-10-CM | POA: Diagnosis not present

## 2022-08-13 LAB — BASIC METABOLIC PANEL
BUN/Creatinine Ratio: 14 (ref 10–24)
BUN: 14 mg/dL (ref 8–27)
CO2: 23 mmol/L (ref 20–29)
Calcium: 10 mg/dL (ref 8.6–10.2)
Chloride: 106 mmol/L (ref 96–106)
Creatinine, Ser: 0.99 mg/dL (ref 0.76–1.27)
Glucose: 106 mg/dL — ABNORMAL HIGH (ref 70–99)
Potassium: 4.7 mmol/L (ref 3.5–5.2)
Sodium: 143 mmol/L (ref 134–144)
eGFR: 74 mL/min/{1.73_m2} (ref >59)

## 2022-08-25 ENCOUNTER — Ambulatory Visit: Payer: Medicare Other | Admitting: Cardiovascular Disease

## 2022-08-25 DIAGNOSIS — D485 Neoplasm of uncertain behavior of skin: Secondary | ICD-10-CM | POA: Diagnosis not present

## 2022-08-25 DIAGNOSIS — D04111 Carcinoma in situ of skin of right upper eyelid, including canthus: Secondary | ICD-10-CM | POA: Diagnosis not present

## 2022-08-25 DIAGNOSIS — L570 Actinic keratosis: Secondary | ICD-10-CM | POA: Diagnosis not present

## 2022-08-25 DIAGNOSIS — Z85828 Personal history of other malignant neoplasm of skin: Secondary | ICD-10-CM | POA: Diagnosis not present

## 2022-08-25 DIAGNOSIS — L821 Other seborrheic keratosis: Secondary | ICD-10-CM | POA: Diagnosis not present

## 2022-08-25 DIAGNOSIS — D0421 Carcinoma in situ of skin of right ear and external auricular canal: Secondary | ICD-10-CM | POA: Diagnosis not present

## 2022-08-27 ENCOUNTER — Ambulatory Visit (HOSPITAL_COMMUNITY): Payer: Medicare Other | Attending: Internal Medicine

## 2022-08-27 DIAGNOSIS — R600 Localized edema: Secondary | ICD-10-CM

## 2022-08-27 LAB — ECHOCARDIOGRAM COMPLETE
AR max vel: 2.84 cm2
AV Area VTI: 2.85 cm2
AV Area mean vel: 2.72 cm2
AV Mean grad: 6.7 mmHg
AV Peak grad: 12.5 mmHg
Ao pk vel: 1.77 m/s
Area-P 1/2: 3.53 cm2
S' Lateral: 2.3 cm

## 2022-08-31 ENCOUNTER — Other Ambulatory Visit (HOSPITAL_COMMUNITY): Payer: Medicare Other

## 2022-09-09 ENCOUNTER — Encounter: Payer: Self-pay | Admitting: Internal Medicine

## 2022-09-09 ENCOUNTER — Ambulatory Visit: Payer: Medicare Other | Admitting: Internal Medicine

## 2022-09-09 VITALS — BP 126/64 | HR 61 | Ht 69.0 in | Wt 149.0 lb

## 2022-09-09 DIAGNOSIS — R609 Edema, unspecified: Secondary | ICD-10-CM | POA: Insufficient documentation

## 2022-09-09 DIAGNOSIS — R600 Localized edema: Secondary | ICD-10-CM

## 2022-09-09 NOTE — Patient Instructions (Signed)
Medication Instructions:  Your physician recommends that you continue on your current medications as directed. Please refer to the Current Medication list given to you today.  *If you need a refill on your cardiac medications before your next appointment, please call your pharmacy*   Follow-Up: At [facility], you and your health needs are our priority.  As part of our continuing mission to provide you with exceptional heart care, we have created designated Provider Care Teams.  These Care Teams include your primary Cardiologist (physician) and Advanced Practice Providers (APPs -  Physician Assistants and Nurse Practitioners) who all work together to provide you with the care you need, when you need it.   Your next appointment:   As needed   Provider:   Dr Wyline Mood

## 2022-09-09 NOTE — Progress Notes (Signed)
Cardiology Office Note:    Date:  09/09/2022   ID:  [name], DOB Jun 08, 1932, MRN [id number]  PCP:  Georgann Housekeeper, MD    [facility] Providers Cardiologist:  Maisie Fus, MD     Referring MD: Georgann Housekeeper, MD   No chief complaint on file. LE edema  History of Present Illness:    [name] is a 89 y.o. male with a hx of asthma, prostate CA,  prior PCI unknown vessel, in ~2008 at [facility], DM2, no cardiac dx hx, referral for an acute visit fo BL ankle swelling.  He had low BNP (61) drawn by her PCP Dr. Donette [name].  Normal TSH.  Echo in 2017 normal for age, mild MR. He came back from a vacation with 27 hours of travel. He was started on lasix with improved his mild edema. This all occurred after his trip. Concerned because if he stops lasix his swelling can increase.  He has not been active since he returned from his trip about a month ago. He is more fatigued.  No CP, No SOB. No orthopnea/PND. Sleep apnea on CPAP machine He is a retired ophthalmologist   Interim hx 09/09/2022 His swelling has resolved. No orthopnea/PND.   Past Medical History:  Diagnosis Date   Asthma    Cancer ([facility])    prostate with seeds   Coronary artery disease    Diabetes mellitus    High cholesterol     Past Surgical History:  Procedure Laterality Date   CATARACT EXTRACTION W/PHACO Right 1998   Dr. Stephens Shire   CATARACT EXTRACTION W/PHACO Left 2003   Dr. Vanice Sarah   CATARACT EXTRACTION, BILATERAL  1998, 2002   CORONARY ARTERY BYPASS GRAFT     HERNIA REPAIR     INSERTION PROSTATE RADIATION SEED     LEG SURGERY      Current Medications: Current Outpatient Medications on File Prior to Visit  Medication Sig Dispense Refill   albuterol (PROVENTIL HFA;VENTOLIN HFA) 108 (90 BASE) MCG/ACT inhaler Inhale 2 puffs into the lungs every 6 (six) hours as needed. For shortness of breath.     aspirin EC 81 MG tablet Take 81 mg by mouth daily.     atorvastatin (LIPITOR) 40 MG tablet Take 40 mg by mouth  daily.     Blood Glucose Monitoring Suppl (IGLUCOSE MONITORING SYSTEM) w/Device KIT [id number]     Cholecalciferol (VITAMIN D-1000 MAX ST PO) Take 3,000 mg by mouth.     Coenzyme Q10 (COQ10) 400 MG CAPS Take by mouth.     esomeprazole (NEXIUM) 40 MG capsule TAKE 1 CAPSULE BY MOUTH  DAILY IN THE MORNING 90 capsule 0   famotidine (PEPCID) 20 MG tablet Take 2 tablets every evening as directed (Patient taking differently: Take 20 mg by mouth at bedtime. Take 2 tablets every evening as directed) 60 tablet 5   fexofenadine (ALLEGRA) 180 MG tablet Take 180 mg by mouth. Takes 2 tablets in the morning, 1 tablet 6 hours later, then 2 tablets 8 hours later. (Patient not taking: Reported on 07/29/2022)     FLOVENT HFA 110 MCG/ACT inhaler  (Patient not taking: Reported on 07/29/2022)     furosemide (LASIX) 20 MG tablet Take 20 mg by mouth daily.     IGLUCOSE TEST STRIPS test strip [id number]     Lancets (STERILANCE TL) MISC [id number]     levocetirizine (XYZAL) 5 MG tablet Take 5 mg by mouth in the morning and at bedtime.  metFORMIN (GLUCOPHAGE) 500 MG tablet Take 500-1,000 mg by mouth at bedtime. Take 500 mg every morning Take 1000 mg at bedtime     mometasone-formoterol (DULERA) 200-5 MCG/ACT AERO Inhale 2 puffs twice daily with spacer to prevent cough/wheeze. Rinse, gargle and spit after each use. 13 g 5   potassium chloride (KLOR-CON) 8 MEQ tablet Take 8 mEq by mouth daily.     vitamin B-12 (CYANOCOBALAMIN) 1000 MCG tablet Take 3,000 mcg by mouth daily.     No current facility-administered medications on file prior to visit.     Allergies:   Lisinopril and Sulfa antibiotics   Social History   Socioeconomic History   Marital status: Married    Spouse name: Not on file   Number of children: 2   Years of education: MD   Highest education level: Not on file  Occupational History   Occupation: [employer]  Tobacco Use   Smoking status: Never   Smokeless tobacco: Never  Vaping Use   Vaping status: Never Used   Substance and Sexual Activity   Alcohol use: Yes    Alcohol/week: 10.0 standard drinks of alcohol    Types: 10 Standard drinks or equivalent per week    Comment: 1-2 drinks 4-5 days per week   Drug use: No   Sexual activity: Not on file  Other Topics Concern   Not on file  Social History Narrative   Lives at home w/ his wife   Right-handed   Caffeine: 3-4 cups per day   Social Determinants of Health   Financial Resource Strain: Not on file  Food Insecurity: Not on file  Transportation Needs: Not on file  Physical Activity: Not on file  Stress: Not on file  Social Connections: Unknown (06/30/2021)   Received from [employer]   Social Network    Social Network: Not on file     Family History: The patient's family history includes Colon cancer in his paternal grandfather; Heart attack in his mother; Prostate cancer in his father. There is no history of Allergic rhinitis, Angioedema, Asthma, Atopy, Eczema, Immunodeficiency, or Urticaria.  ROS:   Please see the history of present illness.     All other systems reviewed and are negative.  EKGs/Labs/Other Studies Reviewed:    The following studies were reviewed today:  08/27/2022-TTE: EF 55-60%, strain mildly low, normal RV, aortic root 40 mm. E/e' 13, Grade 1  EKG:  EKG is  ordered today.  The ekg ordered today demonstrates   07/29/2022- sinus bradycardia HR 53 bpm  Recent Labs: 08/12/2022: BUN 14; Creatinine, Ser 0.99; Potassium 4.7; Sodium 143   Recent Lipid Panel No results found for: "CHOL", "TRIG", "HDL", "CHOLHDL", "VLDL", "LDLCALC", "LDLDIRECT"   Risk Assessment/Calculations:     Physical Exam:    VS:   Vitals:   09/09/22 1539  BP: 126/64  Pulse: 61  SpO2: 94%     Wt Readings from Last 3 Encounters:  07/29/22 150 lb 6.4 oz (68.2 kg)  10/10/18 163 lb 12.8 oz (74.3 kg)  12/15/17 170 lb (77.1 kg)     GEN:  Well nourished, well developed in no acute distress HEENT: Normal NECK: No JVD; No carotid  bruits CARDIAC: RRR, II/VI SEM RUSB RESPIRATORY:  Clear to auscultation without rales, wheezing or rhonchi  ABDOMEN: Soft, non-tender, non-distended MUSCULOSKELETAL:  trace to 1+ pitting in the foot; No deformity  SKIN: Warm and dry NEUROLOGIC:  Alert and oriented x 3 PSYCHIATRIC:  Normal affect   ASSESSMENT:  Dependant Edema: likely dependant edema with no pnd/orthopnea and normal BNP. Discussed considering more activity and wearing his compression stockings. TTE did not show signs of significant LVEDP.   PLAN:    In order of problems listed above:  No further cardiac w/u recommended Follow up PRN            Medication Adjustments/Labs and Tests Ordered: Current medicines are reviewed at length with the patient today.  Concerns regarding medicines are outlined above.  No orders of the defined types were placed in this encounter.  No orders of the defined types were placed in this encounter.   There are no Patient Instructions on file for this visit.   Signed, Maisie Fus, MD  09/09/2022 12:05 PM    [facility]

## 2022-09-14 DIAGNOSIS — D0421 Carcinoma in situ of skin of right ear and external auricular canal: Secondary | ICD-10-CM | POA: Diagnosis not present

## 2022-09-14 DIAGNOSIS — Z85828 Personal history of other malignant neoplasm of skin: Secondary | ICD-10-CM | POA: Diagnosis not present

## 2022-10-29 DIAGNOSIS — C61 Malignant neoplasm of prostate: Secondary | ICD-10-CM | POA: Diagnosis not present

## 2022-11-11 DIAGNOSIS — E782 Mixed hyperlipidemia: Secondary | ICD-10-CM | POA: Diagnosis not present

## 2022-11-11 DIAGNOSIS — Z0000 Encounter for general adult medical examination without abnormal findings: Secondary | ICD-10-CM | POA: Diagnosis not present

## 2022-11-11 DIAGNOSIS — I872 Venous insufficiency (chronic) (peripheral): Secondary | ICD-10-CM | POA: Diagnosis not present

## 2022-11-11 DIAGNOSIS — G4733 Obstructive sleep apnea (adult) (pediatric): Secondary | ICD-10-CM | POA: Diagnosis not present

## 2022-11-11 DIAGNOSIS — E1169 Type 2 diabetes mellitus with other specified complication: Secondary | ICD-10-CM | POA: Diagnosis not present

## 2022-11-11 DIAGNOSIS — D692 Other nonthrombocytopenic purpura: Secondary | ICD-10-CM | POA: Diagnosis not present

## 2022-11-11 DIAGNOSIS — Z23 Encounter for immunization: Secondary | ICD-10-CM | POA: Diagnosis not present

## 2022-11-11 DIAGNOSIS — I10 Essential (primary) hypertension: Secondary | ICD-10-CM | POA: Diagnosis not present

## 2022-11-11 DIAGNOSIS — Z1389 Encounter for screening for other disorder: Secondary | ICD-10-CM | POA: Diagnosis not present

## 2022-11-11 DIAGNOSIS — J4540 Moderate persistent asthma, uncomplicated: Secondary | ICD-10-CM | POA: Diagnosis not present

## 2022-11-11 DIAGNOSIS — I2510 Atherosclerotic heart disease of native coronary artery without angina pectoris: Secondary | ICD-10-CM | POA: Diagnosis not present

## 2022-12-29 DIAGNOSIS — L821 Other seborrheic keratosis: Secondary | ICD-10-CM | POA: Diagnosis not present

## 2022-12-29 DIAGNOSIS — Z85828 Personal history of other malignant neoplasm of skin: Secondary | ICD-10-CM | POA: Diagnosis not present

## 2022-12-29 DIAGNOSIS — L905 Scar conditions and fibrosis of skin: Secondary | ICD-10-CM | POA: Diagnosis not present

## 2022-12-29 DIAGNOSIS — L570 Actinic keratosis: Secondary | ICD-10-CM | POA: Diagnosis not present

## 2022-12-29 DIAGNOSIS — D692 Other nonthrombocytopenic purpura: Secondary | ICD-10-CM | POA: Diagnosis not present

## 2023-01-03 DIAGNOSIS — Z23 Encounter for immunization: Secondary | ICD-10-CM | POA: Diagnosis not present

## 2023-03-24 DIAGNOSIS — J301 Allergic rhinitis due to pollen: Secondary | ICD-10-CM | POA: Diagnosis not present

## 2023-03-24 DIAGNOSIS — L299 Pruritus, unspecified: Secondary | ICD-10-CM | POA: Diagnosis not present

## 2023-03-24 DIAGNOSIS — J3089 Other allergic rhinitis: Secondary | ICD-10-CM | POA: Diagnosis not present

## 2023-03-24 DIAGNOSIS — J4540 Moderate persistent asthma, uncomplicated: Secondary | ICD-10-CM | POA: Diagnosis not present

## 2023-03-30 DIAGNOSIS — R053 Chronic cough: Secondary | ICD-10-CM | POA: Diagnosis not present

## 2023-03-30 DIAGNOSIS — R059 Cough, unspecified: Secondary | ICD-10-CM | POA: Diagnosis not present

## 2023-05-03 DIAGNOSIS — J4540 Moderate persistent asthma, uncomplicated: Secondary | ICD-10-CM | POA: Diagnosis not present

## 2023-05-03 DIAGNOSIS — I2510 Atherosclerotic heart disease of native coronary artery without angina pectoris: Secondary | ICD-10-CM | POA: Diagnosis not present

## 2023-05-03 DIAGNOSIS — R600 Localized edema: Secondary | ICD-10-CM | POA: Diagnosis not present

## 2023-05-03 DIAGNOSIS — E782 Mixed hyperlipidemia: Secondary | ICD-10-CM | POA: Diagnosis not present

## 2023-05-03 DIAGNOSIS — S81812D Laceration without foreign body, left lower leg, subsequent encounter: Secondary | ICD-10-CM | POA: Diagnosis not present

## 2023-05-03 DIAGNOSIS — D692 Other nonthrombocytopenic purpura: Secondary | ICD-10-CM | POA: Diagnosis not present

## 2023-05-03 DIAGNOSIS — G4733 Obstructive sleep apnea (adult) (pediatric): Secondary | ICD-10-CM | POA: Diagnosis not present

## 2023-05-03 DIAGNOSIS — E1169 Type 2 diabetes mellitus with other specified complication: Secondary | ICD-10-CM | POA: Diagnosis not present

## 2023-05-03 DIAGNOSIS — Z4802 Encounter for removal of sutures: Secondary | ICD-10-CM | POA: Diagnosis not present

## 2023-05-03 DIAGNOSIS — I10 Essential (primary) hypertension: Secondary | ICD-10-CM | POA: Diagnosis not present

## 2023-05-09 DIAGNOSIS — J40 Bronchitis, not specified as acute or chronic: Secondary | ICD-10-CM | POA: Diagnosis not present

## 2023-05-09 DIAGNOSIS — G4733 Obstructive sleep apnea (adult) (pediatric): Secondary | ICD-10-CM | POA: Diagnosis not present

## 2023-05-09 DIAGNOSIS — E119 Type 2 diabetes mellitus without complications: Secondary | ICD-10-CM | POA: Diagnosis not present

## 2023-05-09 DIAGNOSIS — E1169 Type 2 diabetes mellitus with other specified complication: Secondary | ICD-10-CM | POA: Diagnosis not present

## 2023-05-09 DIAGNOSIS — H43312 Vitreous membranes and strands, left eye: Secondary | ICD-10-CM | POA: Diagnosis not present

## 2023-05-09 DIAGNOSIS — H43813 Vitreous degeneration, bilateral: Secondary | ICD-10-CM | POA: Diagnosis not present

## 2023-05-09 DIAGNOSIS — I2510 Atherosclerotic heart disease of native coronary artery without angina pectoris: Secondary | ICD-10-CM | POA: Diagnosis not present

## 2023-05-09 DIAGNOSIS — I10 Essential (primary) hypertension: Secondary | ICD-10-CM | POA: Diagnosis not present

## 2023-05-16 DIAGNOSIS — I2510 Atherosclerotic heart disease of native coronary artery without angina pectoris: Secondary | ICD-10-CM | POA: Diagnosis not present

## 2023-05-16 DIAGNOSIS — E782 Mixed hyperlipidemia: Secondary | ICD-10-CM | POA: Diagnosis not present

## 2023-05-16 DIAGNOSIS — I10 Essential (primary) hypertension: Secondary | ICD-10-CM | POA: Diagnosis not present

## 2023-05-16 DIAGNOSIS — E119 Type 2 diabetes mellitus without complications: Secondary | ICD-10-CM | POA: Diagnosis not present

## 2023-06-07 DIAGNOSIS — I10 Essential (primary) hypertension: Secondary | ICD-10-CM | POA: Diagnosis not present

## 2023-06-07 DIAGNOSIS — J40 Bronchitis, not specified as acute or chronic: Secondary | ICD-10-CM | POA: Diagnosis not present

## 2023-06-07 DIAGNOSIS — I2510 Atherosclerotic heart disease of native coronary artery without angina pectoris: Secondary | ICD-10-CM | POA: Diagnosis not present

## 2023-06-07 DIAGNOSIS — E1169 Type 2 diabetes mellitus with other specified complication: Secondary | ICD-10-CM | POA: Diagnosis not present

## 2023-06-15 DIAGNOSIS — E782 Mixed hyperlipidemia: Secondary | ICD-10-CM | POA: Diagnosis not present

## 2023-06-15 DIAGNOSIS — J40 Bronchitis, not specified as acute or chronic: Secondary | ICD-10-CM | POA: Diagnosis not present

## 2023-06-15 DIAGNOSIS — I10 Essential (primary) hypertension: Secondary | ICD-10-CM | POA: Diagnosis not present

## 2023-06-15 DIAGNOSIS — E119 Type 2 diabetes mellitus without complications: Secondary | ICD-10-CM | POA: Diagnosis not present

## 2023-06-15 DIAGNOSIS — I2510 Atherosclerotic heart disease of native coronary artery without angina pectoris: Secondary | ICD-10-CM | POA: Diagnosis not present

## 2023-06-15 DIAGNOSIS — E1169 Type 2 diabetes mellitus with other specified complication: Secondary | ICD-10-CM | POA: Diagnosis not present

## 2023-07-06 ENCOUNTER — Ambulatory Visit (HOSPITAL_COMMUNITY)
Admission: EM | Admit: 2023-07-06 | Discharge: 2023-07-06 | Disposition: A | Discharge status: Home / Self Care | Attending: Emergency Medicine | Admitting: Emergency Medicine

## 2023-07-06 ENCOUNTER — Encounter (HOSPITAL_COMMUNITY): Payer: Self-pay | Admitting: Emergency Medicine

## 2023-07-06 ENCOUNTER — Other Ambulatory Visit: Payer: Self-pay

## 2023-07-06 DIAGNOSIS — S81811A Laceration without foreign body, right lower leg, initial encounter: Secondary | ICD-10-CM | POA: Diagnosis not present

## 2023-07-06 MED ORDER — LIDOCAINE-EPINEPHRINE 1 %-1:100000 IJ SOLN
INTRAMUSCULAR | Status: AC
  Filled 2023-07-06: qty 1

## 2023-07-06 MED ORDER — MUPIROCIN 2 % EX OINT: 1.0000 | TOPICAL_OINTMENT | Freq: Two times a day (BID) | CUTANEOUS | 0 refills | Status: AC

## 2023-07-06 MED ORDER — DOXYCYCLINE HYCLATE 100 MG PO CAPS: 100.0000 mg | ORAL_CAPSULE | Freq: Two times a day (BID) | ORAL | 0 refills | Status: AC

## 2023-07-06 NOTE — ED Provider Notes (Signed)
 [facility]    CSN: [id number] Arrival date & time: 07/06/23  1202      History   Chief Complaint Chief Complaint  Patient presents with   Laceration    HPI [name] is a 89 y.o. male.   Patient presents to clinic over concerns of a laceration to his right lower leg that he sustained yesterday.  He was on his boat when he slipped and cut his leg on a plastic piece.  His tetanus shot was updated around 4 or 5 months ago.  Does have a large laceration to the right lower leg.  Immediately afterwards he cleaned it with hydrogen peroxide and placed antibacterial ointment on it.  Did try to approximate the edges with Band-Aids without full success.  Blood some on his sheets over the night and decided to come in today for sutures.  The history is provided by the patient and medical records.  Laceration   Past Medical History:  Diagnosis Date   Asthma    Cancer (HCC)    prostate with seeds   Coronary artery disease    Diabetes mellitus    High cholesterol     Patient Active Problem List   Diagnosis Date Noted   Vitreous membranes or strands, left 03/26/2021   Posterior vitreous detachment of both eyes 03/26/2021   Obstructive sleep apnea hypopnea, moderate 06/26/2020   Cholesterol retinal embolus of left eye 06/05/2019   Diabetes mellitus without complication (HCC) 06/05/2019   Extrinsic asthma with exacerbation 06/05/2019   Cough variant asthma vs uacs 10/06/2017   Hematuria 09/30/2011   Hyponatremia 09/30/2011   Acute on chronic renal failure (HCC) 09/30/2011   Leukocytosis 09/30/2011   Proteinuria 09/30/2011    Past Surgical History:  Procedure Laterality Date   CATARACT EXTRACTION W/PHACO Right 1998   Dr. Valentin Gaskins   CATARACT EXTRACTION W/PHACO Left 2003   Dr. Maryfrances Snell   CATARACT EXTRACTION, BILATERAL  1998, 2002   CORONARY ARTERY BYPASS GRAFT     HERNIA REPAIR     INSERTION PROSTATE RADIATION SEED     LEG SURGERY         Home Medications     Prior to Admission medications   Medication Sig Start Date End Date Taking? Authorizing Provider  doxycycline (VIBRAMYCIN) 100 MG capsule Take 1 capsule (100 mg total) by mouth 2 (two) times daily for 7 days. 07/06/23 07/13/23 Yes Jenicka Coxe  N, FNP  mupirocin ointment (BACTROBAN) 2 % Apply 1 Application topically 2 (two) times daily. 07/06/23  Yes Severus Brodzinski  N, FNP  albuterol  (PROVENTIL  HFA;VENTOLIN  HFA) 108 (90 BASE) MCG/ACT inhaler Inhale 2 puffs into the lungs every 6 (six) hours as needed. For shortness of breath.    [provider]  aspirin  EC 81 MG tablet Take 81 mg by mouth daily.    [provider]  atorvastatin  (LIPITOR) 40 MG tablet Take 40 mg by mouth daily.    [provider]  Blood Glucose Monitoring Suppl [facility] MONITORING SYSTEM) w/Device Suzanne Erps [id number] 05/23/19   [provider]  Cholecalciferol (VITAMIN D-1000 MAX ST PO) Take 3,000 mg by mouth.    [provider]  Coenzyme Q10 (COQ10) 400 MG CAPS Take by mouth.    [provider]  esomeprazole  (NEXIUM ) 40 MG capsule TAKE 1 CAPSULE BY MOUTH  DAILY IN THE MORNING 05/28/19   Kozlow, Rema Care, MD  famotidine  (PEPCID ) 20 MG tablet Take 2 tablets every evening as directed Patient taking differently: Take 20 mg  by mouth as needed. Take 2 tablets every evening as directed 02/13/18   Kozlow, Eric J, MD  fexofenadine (ALLEGRA) 180 MG tablet Take 180 mg by mouth. Takes 2 tablets in the morning, 1 tablet 6 hours later, then 2 tablets 8 hours later.    [provider]  FLOVENT [facility] 110 MCG/ACT inhaler  06/27/18   [provider]  IGLUCOSE TEST STRIPS test strip [id number] 05/23/19   [provider]  Lancets Eddie Good TL) MISC [id number] 05/23/19   [provider]  levocetirizine (XYZAL) 5 MG tablet Take 5 mg by mouth in the morning and at bedtime.    [provider]  metFORMIN  (GLUCOPHAGE ) 500 MG tablet Take 500-1,000 mg by mouth at bedtime. Take  500 mg every morning Take 1000 mg at bedtime    [provider]  mometasone -formoterol  (DULERA ) 200-5 MCG/ACT AERO Inhale 2 puffs twice daily with spacer to prevent cough/wheeze. Rinse, gargle and spit after each use. 01/10/19   Kozlow, Eric J, MD  potassium chloride (KLOR-CON) 8 MEQ tablet Take 8 mEq by mouth daily. 07/02/22   [provider]  vitamin B-12 (CYANOCOBALAMIN) 1000 MCG tablet Take 3,000 mcg by mouth daily.    [provider]    Family History Family History  Problem Relation Age of Onset   Heart attack Mother    Prostate cancer Father    Colon cancer Paternal Grandfather    Allergic rhinitis Neg Hx    Angioedema Neg Hx    Asthma Neg Hx    Atopy Neg Hx    Eczema Neg Hx    Immunodeficiency Neg Hx    Urticaria Neg Hx     Social History Social History   Tobacco Use   Smoking status: Never   Smokeless tobacco: Never  Vaping Use   Vaping status: Never Used  Substance Use Topics   Alcohol use: Yes    Alcohol/week: 10.0 standard drinks of alcohol    Types: 10 Standard drinks or equivalent per week    Comment: 1-2 drinks 4-5 days per week   Drug use: No     Allergies   Lisinopril and Sulfa antibiotics   Review of Systems Review of Systems  Per HPI  Physical Exam Triage Vital Signs ED Triage Vitals  Encounter Vitals Group     BP 07/06/23 1230 (!) 147/84     Systolic BP Percentile --      Diastolic BP Percentile --      Pulse Rate 07/06/23 1230 (!) 59     Resp 07/06/23 1230 20     Temp 07/06/23 1230 98.4 F (36.9 C)     Temp Source 07/06/23 1230 Oral     SpO2 07/06/23 1230 94 %     Weight --      Height --      Head Circumference --      Peak Flow --      Pain Score 07/06/23 1227 0     Pain Loc --      Pain Education --      Exclude from Growth Chart --    No data found.  Updated Vital Signs BP (!) 147/84 (BP Location: Left Arm)   Pulse (!) 59   Temp 98.4 F (36.9 C) (Oral)   Resp 20   SpO2 94%   Visual  Acuity Right Eye Distance:   Left Eye Distance:   Bilateral Distance:    Right Eye Near:   Left Eye Near:  Bilateral Near:     Physical Exam Vitals and nursing note reviewed.  Constitutional:      Appearance: Normal appearance.  HENT:     Head: Normocephalic and atraumatic.     Right Ear: External ear normal.     Left Ear: External ear normal.     Nose: Nose normal.     Mouth/Throat:     Mouth: Mucous membranes are moist.  Cardiovascular:     Rate and Rhythm: Normal rate.  Pulmonary:     Effort: Pulmonary effort is normal. No respiratory distress.  Musculoskeletal:        General: Normal range of motion.  Skin:    General: Skin is warm and dry.  Neurological:     General: No focal deficit present.     Mental Status: He is alert.  Psychiatric:        Mood and Affect: Mood normal.      UC Treatments / Results  Labs (all labs ordered are listed, but only abnormal results are displayed) Labs Reviewed - No data to display  EKG   Radiology No results found.  Procedures Laceration Repair  Date/Time: 07/06/2023 1:55 PM  Performed by: Harlow Lighter, Zofia Peckinpaugh  N, FNP Authorized by: Harlow Lighter, Chantil Bari  N, FNP   Consent:    Consent obtained:  Verbal   Consent given by:  Patient   Risks discussed:  Infection, need for additional repair, pain, poor cosmetic result and poor wound healing   Alternatives discussed:  No treatment and delayed treatment Universal protocol:    Procedure explained and questions answered to patient or proxy's satisfaction: yes     Immediately prior to procedure, a time out was called: yes     Patient identity confirmed:  Verbally with patient Anesthesia:    Anesthesia method:  Local infiltration   Local anesthetic:  Lidocaine  1% WITH epi Laceration details:    Location:  Leg   Leg location:  R lower leg   Length (cm):  13   Depth (mm):  5 Pre-procedure details:    Preparation:  Patient was prepped and draped in usual sterile  fashion Exploration:    Limited defect created (wound extended): yes     Hemostasis achieved with:  Direct pressure   Imaging outcome: foreign body not noted     Wound exploration: wound explored through full range of motion     Contaminated: no   Treatment:    Area cleansed with:  Povidone-iodine   Amount of cleaning:  Extensive   Irrigation solution: wound cleaner.   Irrigation volume:  50 cc   Debridement:  None Skin repair:    Repair method:  Sutures   Suture size:  4-0   Suture material:  Prolene   Suture technique:  Horizontal mattress and simple interrupted   Number of sutures:  16 Approximation:    Approximation:  Close Repair type:    Repair type:  Intermediate Post-procedure details:    Dressing:  Non-adherent dressing   Procedure completion:  Tolerated well, no immediate complications  (including critical care time)  Medications Ordered in UC Medications - No data to display  Initial Impression / Assessment and Plan / UC Course  I have reviewed the triage vital signs and the nursing notes.  Pertinent labs & imaging results that were available during my care of the patient were reviewed by me and considered in my medical decision making (see chart for details).  Vitals in triage reviewed, patient is hemodynamically stable.  Large hook  shaped laceration to the right outer lower leg.  Area numbed, cleaned and sutured.  3 horizontal mattress sutures placed and 13 simple interrupted sutures placed to help approximate edges.  Will start on doxycycline to help prevent from bacterial infection.  Laceration management and wound care management discussed.  Plan of care, follow-up care return precautions given, no questions at this time.     Final Clinical Impressions(s) / UC Diagnoses   Final diagnoses:  Laceration of right lower extremity, initial encounter     Discharge Instructions      Thank you very much for allowing me to take part in your care today.    We  have placed a total of 16 sutures in your right lower leg.  Keep the area clean and dry for the next 24 hours.  Afterwards you can clean with warm water and antibacterial soap.  Take the antibiotics twice daily with food to help prevent bacterial infection.  Use the topical antibacterial ointment as well.  Return to clinic in 8 to 10 days for suture removal.  Return sooner if you develop signs of infection.   ED Prescriptions     Medication Sig Dispense Auth. Provider   doxycycline (VIBRAMYCIN) 100 MG capsule Take 1 capsule (100 mg total) by mouth 2 (two) times daily for 7 days. 14 capsule Harlow Lighter, Damontae Loppnow  N, FNP   mupirocin ointment (BACTROBAN) 2 % Apply 1 Application topically 2 (two) times daily. 22 g Harlow Lighter, Dandria Griego  N, FNP      PDMP not reviewed this encounter.   Terressa Fess, [place] 07/06/23 1358

## 2023-07-06 NOTE — ED Triage Notes (Signed)
 Stepped on plastic frame and cushion .  Left foot remained level, right foot shifted downward, rubbing against a harder surface and tearing skin on right lower leg.  Has cleaned wound, applied bandages to wound.    Reports a tetanus 4-5 months ago

## 2023-07-06 NOTE — Discharge Instructions (Addendum)
 Thank you very much for allowing me to take part in your care today.    We have placed a total of 16 sutures in your right lower leg.  Keep the area clean and dry for the next 24 hours.  Afterwards you can clean with warm water and antibacterial soap.  Take the antibiotics twice daily with food to help prevent bacterial infection.  Use the topical antibacterial ointment as well.  Return to clinic in 8 to 10 days for suture removal.  Return sooner if you develop signs of infection.

## 2023-07-07 DIAGNOSIS — I2510 Atherosclerotic heart disease of native coronary artery without angina pectoris: Secondary | ICD-10-CM | POA: Diagnosis not present

## 2023-07-07 DIAGNOSIS — I10 Essential (primary) hypertension: Secondary | ICD-10-CM | POA: Diagnosis not present

## 2023-07-07 DIAGNOSIS — E1169 Type 2 diabetes mellitus with other specified complication: Secondary | ICD-10-CM | POA: Diagnosis not present

## 2023-07-07 DIAGNOSIS — J40 Bronchitis, not specified as acute or chronic: Secondary | ICD-10-CM | POA: Diagnosis not present

## 2023-07-13 DIAGNOSIS — S80811S Abrasion, right lower leg, sequela: Secondary | ICD-10-CM | POA: Diagnosis not present

## 2023-07-13 DIAGNOSIS — Z85828 Personal history of other malignant neoplasm of skin: Secondary | ICD-10-CM | POA: Diagnosis not present

## 2023-07-13 DIAGNOSIS — D692 Other nonthrombocytopenic purpura: Secondary | ICD-10-CM | POA: Diagnosis not present

## 2023-07-13 DIAGNOSIS — L0889 Other specified local infections of the skin and subcutaneous tissue: Secondary | ICD-10-CM | POA: Diagnosis not present

## 2023-07-13 DIAGNOSIS — L820 Inflamed seborrheic keratosis: Secondary | ICD-10-CM | POA: Diagnosis not present

## 2023-07-14 ENCOUNTER — Ambulatory Visit (HOSPITAL_COMMUNITY)

## 2023-07-16 DIAGNOSIS — I10 Essential (primary) hypertension: Secondary | ICD-10-CM | POA: Diagnosis not present

## 2023-07-16 DIAGNOSIS — E782 Mixed hyperlipidemia: Secondary | ICD-10-CM | POA: Diagnosis not present

## 2023-07-16 DIAGNOSIS — E119 Type 2 diabetes mellitus without complications: Secondary | ICD-10-CM | POA: Diagnosis not present

## 2023-07-16 DIAGNOSIS — E1169 Type 2 diabetes mellitus with other specified complication: Secondary | ICD-10-CM | POA: Diagnosis not present

## 2023-07-16 DIAGNOSIS — J40 Bronchitis, not specified as acute or chronic: Secondary | ICD-10-CM | POA: Diagnosis not present

## 2023-07-16 DIAGNOSIS — I2510 Atherosclerotic heart disease of native coronary artery without angina pectoris: Secondary | ICD-10-CM | POA: Diagnosis not present

## 2023-07-21 ENCOUNTER — Ambulatory Visit (HOSPITAL_COMMUNITY)
Admission: RE | Admit: 2023-07-21 | Discharge: 2023-07-21 | Disposition: A | Discharge status: Home / Self Care | Source: Ambulatory Visit | Attending: Internal Medicine | Admitting: Internal Medicine

## 2023-07-21 DIAGNOSIS — S81811D Laceration without foreign body, right lower leg, subsequent encounter: Secondary | ICD-10-CM

## 2023-07-21 NOTE — ED Triage Notes (Signed)
 Patient presenting for suture removal post 15 days from placement. Patient states not having any problems with the sutures. States he went to see his dermatologist and was told by them to wait another week before getting the sutures removed, hence the patient is here day 15 to have them taken out.   Wound is clean dry and well healed.

## 2023-08-06 DIAGNOSIS — I10 Essential (primary) hypertension: Secondary | ICD-10-CM | POA: Diagnosis not present

## 2023-08-06 DIAGNOSIS — J40 Bronchitis, not specified as acute or chronic: Secondary | ICD-10-CM | POA: Diagnosis not present

## 2023-08-06 DIAGNOSIS — I2510 Atherosclerotic heart disease of native coronary artery without angina pectoris: Secondary | ICD-10-CM | POA: Diagnosis not present

## 2023-08-06 DIAGNOSIS — E1169 Type 2 diabetes mellitus with other specified complication: Secondary | ICD-10-CM | POA: Diagnosis not present

## 2023-08-10 DIAGNOSIS — R600 Localized edema: Secondary | ICD-10-CM | POA: Diagnosis not present

## 2023-08-10 DIAGNOSIS — T148XXA Other injury of unspecified body region, initial encounter: Secondary | ICD-10-CM | POA: Diagnosis not present

## 2023-08-25 DIAGNOSIS — R351 Nocturia: Secondary | ICD-10-CM | POA: Diagnosis not present

## 2023-08-25 DIAGNOSIS — R350 Frequency of micturition: Secondary | ICD-10-CM | POA: Diagnosis not present

## 2023-08-25 DIAGNOSIS — R3915 Urgency of urination: Secondary | ICD-10-CM | POA: Diagnosis not present

## 2023-08-25 DIAGNOSIS — C61 Malignant neoplasm of prostate: Secondary | ICD-10-CM | POA: Diagnosis not present

## 2023-09-05 DIAGNOSIS — I2510 Atherosclerotic heart disease of native coronary artery without angina pectoris: Secondary | ICD-10-CM | POA: Diagnosis not present

## 2023-09-05 DIAGNOSIS — E1169 Type 2 diabetes mellitus with other specified complication: Secondary | ICD-10-CM | POA: Diagnosis not present

## 2023-09-05 DIAGNOSIS — I10 Essential (primary) hypertension: Secondary | ICD-10-CM | POA: Diagnosis not present

## 2023-09-05 DIAGNOSIS — J40 Bronchitis, not specified as acute or chronic: Secondary | ICD-10-CM | POA: Diagnosis not present

## 2023-09-15 DIAGNOSIS — E782 Mixed hyperlipidemia: Secondary | ICD-10-CM | POA: Diagnosis not present

## 2023-09-15 DIAGNOSIS — E119 Type 2 diabetes mellitus without complications: Secondary | ICD-10-CM | POA: Diagnosis not present

## 2023-09-15 DIAGNOSIS — E1169 Type 2 diabetes mellitus with other specified complication: Secondary | ICD-10-CM | POA: Diagnosis not present

## 2023-09-15 DIAGNOSIS — J40 Bronchitis, not specified as acute or chronic: Secondary | ICD-10-CM | POA: Diagnosis not present

## 2023-09-15 DIAGNOSIS — I10 Essential (primary) hypertension: Secondary | ICD-10-CM | POA: Diagnosis not present

## 2023-09-15 DIAGNOSIS — I2510 Atherosclerotic heart disease of native coronary artery without angina pectoris: Secondary | ICD-10-CM | POA: Diagnosis not present

## 2023-10-05 DIAGNOSIS — I10 Essential (primary) hypertension: Secondary | ICD-10-CM | POA: Diagnosis not present

## 2023-10-05 DIAGNOSIS — J40 Bronchitis, not specified as acute or chronic: Secondary | ICD-10-CM | POA: Diagnosis not present

## 2023-10-05 DIAGNOSIS — E1169 Type 2 diabetes mellitus with other specified complication: Secondary | ICD-10-CM | POA: Diagnosis not present

## 2023-10-05 DIAGNOSIS — I2510 Atherosclerotic heart disease of native coronary artery without angina pectoris: Secondary | ICD-10-CM | POA: Diagnosis not present

## 2023-10-16 DIAGNOSIS — I2510 Atherosclerotic heart disease of native coronary artery without angina pectoris: Secondary | ICD-10-CM | POA: Diagnosis not present

## 2023-10-16 DIAGNOSIS — E782 Mixed hyperlipidemia: Secondary | ICD-10-CM | POA: Diagnosis not present

## 2023-10-16 DIAGNOSIS — E119 Type 2 diabetes mellitus without complications: Secondary | ICD-10-CM | POA: Diagnosis not present

## 2023-10-16 DIAGNOSIS — I10 Essential (primary) hypertension: Secondary | ICD-10-CM | POA: Diagnosis not present

## 2023-10-21 DIAGNOSIS — R350 Frequency of micturition: Secondary | ICD-10-CM | POA: Diagnosis not present

## 2023-10-21 DIAGNOSIS — C61 Malignant neoplasm of prostate: Secondary | ICD-10-CM | POA: Diagnosis not present

## 2023-10-21 DIAGNOSIS — R351 Nocturia: Secondary | ICD-10-CM | POA: Diagnosis not present

## 2023-11-04 DIAGNOSIS — I2510 Atherosclerotic heart disease of native coronary artery without angina pectoris: Secondary | ICD-10-CM | POA: Diagnosis not present

## 2023-11-04 DIAGNOSIS — J40 Bronchitis, not specified as acute or chronic: Secondary | ICD-10-CM | POA: Diagnosis not present

## 2023-11-04 DIAGNOSIS — E1169 Type 2 diabetes mellitus with other specified complication: Secondary | ICD-10-CM | POA: Diagnosis not present

## 2023-11-04 DIAGNOSIS — I10 Essential (primary) hypertension: Secondary | ICD-10-CM | POA: Diagnosis not present

## 2023-11-11 ENCOUNTER — Other Ambulatory Visit (HOSPITAL_BASED_OUTPATIENT_CLINIC_OR_DEPARTMENT_OTHER): Payer: Self-pay

## 2023-11-11 DIAGNOSIS — Z23 Encounter for immunization: Secondary | ICD-10-CM | POA: Diagnosis not present

## 2023-11-11 MED ORDER — FLUZONE HIGH-DOSE 0.5 ML IM SUSY
0.5000 mL | PREFILLED_SYRINGE | Freq: Once | INTRAMUSCULAR | 0 refills | Status: AC
Start: 2023-11-11 — End: 2023-11-12
  Filled 2023-11-11: qty 0.5, 1d supply, fill #0

## 2023-11-14 DIAGNOSIS — Z23 Encounter for immunization: Secondary | ICD-10-CM | POA: Diagnosis not present

## 2023-11-14 DIAGNOSIS — G4733 Obstructive sleep apnea (adult) (pediatric): Secondary | ICD-10-CM | POA: Diagnosis not present

## 2023-11-14 DIAGNOSIS — E559 Vitamin D deficiency, unspecified: Secondary | ICD-10-CM | POA: Diagnosis not present

## 2023-11-14 DIAGNOSIS — R4189 Other symptoms and signs involving cognitive functions and awareness: Secondary | ICD-10-CM | POA: Diagnosis not present

## 2023-11-14 DIAGNOSIS — Z08 Encounter for follow-up examination after completed treatment for malignant neoplasm: Secondary | ICD-10-CM | POA: Diagnosis not present

## 2023-11-14 DIAGNOSIS — I872 Venous insufficiency (chronic) (peripheral): Secondary | ICD-10-CM | POA: Diagnosis not present

## 2023-11-14 DIAGNOSIS — E782 Mixed hyperlipidemia: Secondary | ICD-10-CM | POA: Diagnosis not present

## 2023-11-14 DIAGNOSIS — J4540 Moderate persistent asthma, uncomplicated: Secondary | ICD-10-CM | POA: Diagnosis not present

## 2023-11-14 DIAGNOSIS — E1169 Type 2 diabetes mellitus with other specified complication: Secondary | ICD-10-CM | POA: Diagnosis not present

## 2023-11-14 DIAGNOSIS — Z0000 Encounter for general adult medical examination without abnormal findings: Secondary | ICD-10-CM | POA: Diagnosis not present

## 2023-11-14 DIAGNOSIS — Z8546 Personal history of malignant neoplasm of prostate: Secondary | ICD-10-CM | POA: Diagnosis not present

## 2023-11-14 DIAGNOSIS — I10 Essential (primary) hypertension: Secondary | ICD-10-CM | POA: Diagnosis not present

## 2023-11-14 DIAGNOSIS — Z1331 Encounter for screening for depression: Secondary | ICD-10-CM | POA: Diagnosis not present

## 2023-11-14 DIAGNOSIS — I2510 Atherosclerotic heart disease of native coronary artery without angina pectoris: Secondary | ICD-10-CM | POA: Diagnosis not present

## 2023-11-15 DIAGNOSIS — I10 Essential (primary) hypertension: Secondary | ICD-10-CM | POA: Diagnosis not present

## 2023-11-15 DIAGNOSIS — I2510 Atherosclerotic heart disease of native coronary artery without angina pectoris: Secondary | ICD-10-CM | POA: Diagnosis not present

## 2023-11-15 DIAGNOSIS — E119 Type 2 diabetes mellitus without complications: Secondary | ICD-10-CM | POA: Diagnosis not present

## 2023-11-15 DIAGNOSIS — E782 Mixed hyperlipidemia: Secondary | ICD-10-CM | POA: Diagnosis not present

## 2023-11-15 DIAGNOSIS — E1169 Type 2 diabetes mellitus with other specified complication: Secondary | ICD-10-CM | POA: Diagnosis not present

## 2023-11-15 DIAGNOSIS — J40 Bronchitis, not specified as acute or chronic: Secondary | ICD-10-CM | POA: Diagnosis not present

## 2023-11-24 ENCOUNTER — Other Ambulatory Visit (HOSPITAL_BASED_OUTPATIENT_CLINIC_OR_DEPARTMENT_OTHER): Payer: Self-pay

## 2023-11-24 DIAGNOSIS — Z23 Encounter for immunization: Secondary | ICD-10-CM | POA: Diagnosis not present

## 2023-11-24 MED ORDER — COMIRNATY 30 MCG/0.3ML IM SUSY
0.3000 mL | PREFILLED_SYRINGE | Freq: Once | INTRAMUSCULAR | 0 refills | Status: AC
  Filled 2023-11-24: qty 0.3, 1d supply, fill #0

## 2023-12-04 DIAGNOSIS — E1169 Type 2 diabetes mellitus with other specified complication: Secondary | ICD-10-CM | POA: Diagnosis not present

## 2023-12-04 DIAGNOSIS — J40 Bronchitis, not specified as acute or chronic: Secondary | ICD-10-CM | POA: Diagnosis not present

## 2023-12-04 DIAGNOSIS — I2510 Atherosclerotic heart disease of native coronary artery without angina pectoris: Secondary | ICD-10-CM | POA: Diagnosis not present

## 2023-12-04 DIAGNOSIS — I10 Essential (primary) hypertension: Secondary | ICD-10-CM | POA: Diagnosis not present

## 2023-12-16 DIAGNOSIS — I10 Essential (primary) hypertension: Secondary | ICD-10-CM | POA: Diagnosis not present

## 2023-12-16 DIAGNOSIS — E119 Type 2 diabetes mellitus without complications: Secondary | ICD-10-CM | POA: Diagnosis not present

## 2023-12-16 DIAGNOSIS — I2510 Atherosclerotic heart disease of native coronary artery without angina pectoris: Secondary | ICD-10-CM | POA: Diagnosis not present

## 2023-12-16 DIAGNOSIS — E782 Mixed hyperlipidemia: Secondary | ICD-10-CM | POA: Diagnosis not present

## 2024-01-03 DIAGNOSIS — I10 Essential (primary) hypertension: Secondary | ICD-10-CM | POA: Diagnosis not present

## 2024-01-03 DIAGNOSIS — E1169 Type 2 diabetes mellitus with other specified complication: Secondary | ICD-10-CM | POA: Diagnosis not present

## 2024-01-03 DIAGNOSIS — I2510 Atherosclerotic heart disease of native coronary artery without angina pectoris: Secondary | ICD-10-CM | POA: Diagnosis not present

## 2024-01-03 DIAGNOSIS — J40 Bronchitis, not specified as acute or chronic: Secondary | ICD-10-CM | POA: Diagnosis not present

## 2024-01-15 DIAGNOSIS — I2510 Atherosclerotic heart disease of native coronary artery without angina pectoris: Secondary | ICD-10-CM | POA: Diagnosis not present

## 2024-01-15 DIAGNOSIS — E119 Type 2 diabetes mellitus without complications: Secondary | ICD-10-CM | POA: Diagnosis not present

## 2024-01-15 DIAGNOSIS — E782 Mixed hyperlipidemia: Secondary | ICD-10-CM | POA: Diagnosis not present

## 2024-01-15 DIAGNOSIS — I10 Essential (primary) hypertension: Secondary | ICD-10-CM | POA: Diagnosis not present

## 2024-01-17 DIAGNOSIS — D1801 Hemangioma of skin and subcutaneous tissue: Secondary | ICD-10-CM | POA: Diagnosis not present

## 2024-01-17 DIAGNOSIS — L570 Actinic keratosis: Secondary | ICD-10-CM | POA: Diagnosis not present

## 2024-01-17 DIAGNOSIS — L821 Other seborrheic keratosis: Secondary | ICD-10-CM | POA: Diagnosis not present

## 2024-01-17 DIAGNOSIS — D692 Other nonthrombocytopenic purpura: Secondary | ICD-10-CM | POA: Diagnosis not present

## 2024-01-17 DIAGNOSIS — D225 Melanocytic nevi of trunk: Secondary | ICD-10-CM | POA: Diagnosis not present

## 2024-01-17 DIAGNOSIS — Z85828 Personal history of other malignant neoplasm of skin: Secondary | ICD-10-CM | POA: Diagnosis not present

## 2024-02-27 ENCOUNTER — Encounter: Payer: Self-pay | Admitting: Podiatry

## 2024-02-27 ENCOUNTER — Ambulatory Visit: Admitting: Podiatry

## 2024-02-27 VITALS — Ht 69.0 in | Wt 149.0 lb

## 2024-02-27 DIAGNOSIS — L600 Ingrowing nail: Secondary | ICD-10-CM

## 2024-02-27 NOTE — Progress Notes (Signed)
 Subjective:   Patient ID: [name], male   DOB: 89 y.o.   MRN: [id number]   HPI Patient presents with caregiver with chronic ingrown toenail left big toe that is painful when pressed and is tried to soak and trim it and he is due to go on a trip and it is very painful even sheets at night   Review of Systems  All other systems reviewed and are negative.       Objective:  Physical Exam Vitals and nursing note reviewed.  Constitutional:      Appearance: He is well-developed.  Pulmonary:     Effort: Pulmonary effort is normal.  Musculoskeletal:        General: Normal range of motion.  Skin:    General: Skin is warm.  Neurological:     Mental Status: He is alert.     Neurovascular status intact muscle strength adequate range of motion adequate with patient noted to have incurvated left hallux medial border very painful when pressed localized redness no active drainage was noted associated with deformity     Assessment:  Ingrown toenail deformity left hallux medial border with pain with patient having good digital perfusion well-oriented     Plan:  H&P reviewed to discharge great length that I do think this would be best corrected.  He does understand risk factors and I allowed him to read and then signed consent form and patient wants this done and I infiltrated the left hallux 60 mg Xylocaine  Marcaine mixture sterile prep done using sterile instrumentation remove the medial border exposed matrix applied phenol 3 applications 30 seconds followed by alcohol lavage sterile dressing gave instructions on soaks wear dressing 24 hours take it off earlier if throbbing were to occur

## 2024-02-27 NOTE — Patient Instructions (Signed)
